# Patient Record
Sex: Female | Born: 1965 | Race: White | Hispanic: No | Marital: Married | Smoking: Current every day smoker
Health system: Southern US, Community
[De-identification: ages and names within clinical notes are randomized; demographics above are authoritative.]

## PROBLEM LIST (undated history)

## (undated) DIAGNOSIS — K5792 Diverticulitis of intestine, part unspecified, without perforation or abscess without bleeding: Secondary | ICD-10-CM

## (undated) DIAGNOSIS — I10 Essential (primary) hypertension: Secondary | ICD-10-CM

## (undated) DIAGNOSIS — E162 Hypoglycemia, unspecified: Secondary | ICD-10-CM

## (undated) DIAGNOSIS — K580 Irritable bowel syndrome with diarrhea: Secondary | ICD-10-CM

## (undated) DIAGNOSIS — G43001 Migraine without aura, not intractable, with status migrainosus: Secondary | ICD-10-CM

## (undated) DIAGNOSIS — M549 Dorsalgia, unspecified: Secondary | ICD-10-CM

## (undated) DIAGNOSIS — K219 Gastro-esophageal reflux disease without esophagitis: Secondary | ICD-10-CM

## (undated) DIAGNOSIS — F3130 Bipolar disorder, current episode depressed, mild or moderate severity, unspecified: Secondary | ICD-10-CM

## (undated) DIAGNOSIS — F319 Bipolar disorder, unspecified: Secondary | ICD-10-CM

## (undated) DIAGNOSIS — M797 Fibromyalgia: Secondary | ICD-10-CM

## (undated) DIAGNOSIS — E7800 Pure hypercholesterolemia, unspecified: Secondary | ICD-10-CM

## (undated) DIAGNOSIS — N952 Postmenopausal atrophic vaginitis: Secondary | ICD-10-CM

## (undated) HISTORY — DX: Dorsalgia, unspecified: M54.9

## (undated) HISTORY — DX: Bipolar disorder, current episode depressed, mild or moderate severity, unspecified: F31.30

## (undated) HISTORY — PX: TONSILLECTOMY: SUR1361

## (undated) HISTORY — DX: Irritable bowel syndrome with diarrhea: K58.0

## (undated) HISTORY — DX: Migraine without aura, not intractable, with status migrainosus: G43.001

## (undated) HISTORY — DX: Bipolar disorder, unspecified: F31.9

## (undated) HISTORY — PX: SPINAL FUSION: SHX223

## (undated) HISTORY — DX: Fibromyalgia: M79.7

## (undated) HISTORY — PX: APPENDECTOMY: SHX54

## (undated) HISTORY — DX: Postmenopausal atrophic vaginitis: N95.2

## (undated) HISTORY — PX: ABDOMINAL HYSTERECTOMY: SHX81

## (undated) HISTORY — DX: Gastro-esophageal reflux disease without esophagitis: K21.9

---

## 2003-01-17 ENCOUNTER — Encounter: Payer: Self-pay | Admitting: Neurosurgery

## 2003-01-17 ENCOUNTER — Ambulatory Visit (HOSPITAL_COMMUNITY): Admission: RE | Admit: 2003-01-17 | Discharge: 2003-01-18 | Payer: Self-pay | Admitting: Neurosurgery

## 2003-02-22 ENCOUNTER — Encounter: Admission: RE | Admit: 2003-02-22 | Discharge: 2003-02-22 | Payer: Self-pay | Admitting: Neurosurgery

## 2003-02-22 ENCOUNTER — Encounter: Payer: Self-pay | Admitting: Neurosurgery

## 2003-04-05 ENCOUNTER — Encounter: Admission: RE | Admit: 2003-04-05 | Discharge: 2003-04-05 | Payer: Self-pay | Admitting: Neurosurgery

## 2003-06-24 ENCOUNTER — Ambulatory Visit (HOSPITAL_COMMUNITY): Admission: RE | Admit: 2003-06-24 | Discharge: 2003-06-24 | Payer: Self-pay | Admitting: Neurosurgery

## 2005-02-27 ENCOUNTER — Ambulatory Visit: Payer: Self-pay

## 2005-03-10 ENCOUNTER — Ambulatory Visit: Payer: Self-pay

## 2005-12-03 ENCOUNTER — Ambulatory Visit: Payer: Self-pay | Admitting: Neurosurgery

## 2006-07-22 ENCOUNTER — Ambulatory Visit: Payer: Self-pay | Admitting: Gastroenterology

## 2007-04-21 ENCOUNTER — Other Ambulatory Visit: Payer: Self-pay

## 2007-04-21 ENCOUNTER — Emergency Department: Payer: Self-pay | Admitting: Unknown Physician Specialty

## 2007-05-18 ENCOUNTER — Emergency Department: Payer: Self-pay | Admitting: Emergency Medicine

## 2007-06-21 ENCOUNTER — Emergency Department: Payer: Self-pay | Admitting: Emergency Medicine

## 2007-06-22 ENCOUNTER — Ambulatory Visit: Payer: Self-pay | Admitting: Emergency Medicine

## 2007-08-31 ENCOUNTER — Ambulatory Visit: Payer: Self-pay | Admitting: Family Medicine

## 2007-09-05 ENCOUNTER — Ambulatory Visit: Payer: Self-pay | Admitting: Family Medicine

## 2009-09-26 LAB — HM DEXA SCAN: HM Dexa Scan: NORMAL

## 2009-10-15 ENCOUNTER — Ambulatory Visit: Payer: Self-pay | Admitting: Family Medicine

## 2010-01-07 ENCOUNTER — Ambulatory Visit: Payer: Self-pay | Admitting: Family Medicine

## 2010-01-07 ENCOUNTER — Emergency Department: Payer: Self-pay | Admitting: Emergency Medicine

## 2010-01-22 LAB — HM PAP SMEAR: HM Pap smear: ABNORMAL

## 2010-11-07 LAB — LIPID PANEL
Cholesterol: 191 mg/dL (ref 0–200)
HDL: 55 mg/dL (ref 35–70)
LDL Cholesterol: 110 mg/dL
Triglycerides: 131 mg/dL (ref 40–160)

## 2010-12-11 ENCOUNTER — Ambulatory Visit: Payer: Self-pay | Admitting: Family Medicine

## 2010-12-18 ENCOUNTER — Ambulatory Visit: Payer: Self-pay | Admitting: Urology

## 2012-02-16 ENCOUNTER — Ambulatory Visit: Payer: Self-pay | Admitting: Family Medicine

## 2012-05-19 LAB — HEMOGLOBIN A1C: Hgb A1c MFr Bld: 5.7 % (ref 4.0–6.0)

## 2013-04-12 ENCOUNTER — Ambulatory Visit: Payer: Self-pay | Admitting: Family Medicine

## 2013-04-12 LAB — HM MAMMOGRAPHY: HM Mammogram: NORMAL

## 2013-04-18 ENCOUNTER — Ambulatory Visit: Payer: Self-pay | Admitting: Family Medicine

## 2014-10-15 ENCOUNTER — Telehealth: Payer: Self-pay | Admitting: Family Medicine

## 2014-10-15 NOTE — Telephone Encounter (Signed)
Due to you not having anything available i had to schedule her an appointment for July 12. She will be completely out of her medications. Is it possible for you to refill? Please send to [place]

## 2014-10-15 NOTE — Telephone Encounter (Signed)
She needs to be seen, she takes controlled medications and I will not fill those until seen. The other medications she can ask pharmacy to request

## 2014-10-15 NOTE — Telephone Encounter (Signed)
Please advice  

## 2014-10-17 NOTE — Telephone Encounter (Signed)
Patient did schedule appointment for October 22, 2014

## 2014-10-20 ENCOUNTER — Encounter: Payer: Self-pay | Admitting: Family Medicine

## 2014-10-20 DIAGNOSIS — M549 Dorsalgia, unspecified: Secondary | ICD-10-CM

## 2014-10-20 DIAGNOSIS — K589 Irritable bowel syndrome without diarrhea: Secondary | ICD-10-CM | POA: Insufficient documentation

## 2014-10-20 DIAGNOSIS — K219 Gastro-esophageal reflux disease without esophagitis: Secondary | ICD-10-CM | POA: Insufficient documentation

## 2014-10-20 DIAGNOSIS — G8929 Other chronic pain: Secondary | ICD-10-CM | POA: Insufficient documentation

## 2014-10-20 DIAGNOSIS — M797 Fibromyalgia: Secondary | ICD-10-CM | POA: Insufficient documentation

## 2014-10-20 DIAGNOSIS — F3130 Bipolar disorder, current episode depressed, mild or moderate severity, unspecified: Secondary | ICD-10-CM | POA: Insufficient documentation

## 2014-10-20 DIAGNOSIS — Z720 Tobacco use: Secondary | ICD-10-CM | POA: Insufficient documentation

## 2014-10-20 DIAGNOSIS — N952 Postmenopausal atrophic vaginitis: Secondary | ICD-10-CM | POA: Insufficient documentation

## 2014-10-20 DIAGNOSIS — F5001 Anorexia nervosa, restricting type: Secondary | ICD-10-CM | POA: Insufficient documentation

## 2014-10-20 DIAGNOSIS — M479 Spondylosis, unspecified: Secondary | ICD-10-CM | POA: Insufficient documentation

## 2014-10-20 DIAGNOSIS — N6019 Diffuse cystic mastopathy of unspecified breast: Secondary | ICD-10-CM | POA: Insufficient documentation

## 2014-10-20 DIAGNOSIS — M5412 Radiculopathy, cervical region: Secondary | ICD-10-CM | POA: Insufficient documentation

## 2014-10-20 DIAGNOSIS — G43009 Migraine without aura, not intractable, without status migrainosus: Secondary | ICD-10-CM | POA: Insufficient documentation

## 2014-10-20 DIAGNOSIS — N3946 Mixed incontinence: Secondary | ICD-10-CM | POA: Insufficient documentation

## 2014-10-20 DIAGNOSIS — F410 Panic disorder [episodic paroxysmal anxiety] without agoraphobia: Secondary | ICD-10-CM | POA: Insufficient documentation

## 2014-10-20 HISTORY — DX: Bipolar disorder, current episode depressed, mild or moderate severity, unspecified: F31.30

## 2014-10-22 ENCOUNTER — Encounter: Payer: Self-pay | Admitting: Family Medicine

## 2014-10-22 ENCOUNTER — Ambulatory Visit (INDEPENDENT_AMBULATORY_CARE_PROVIDER_SITE_OTHER): Payer: BC Managed Care – PPO | Admitting: Family Medicine

## 2014-10-22 VITALS — BP 132/78 | HR 97 | Temp 98.3°F | Resp 18 | Ht 64.0 in | Wt 138.9 lb

## 2014-10-22 DIAGNOSIS — M79604 Pain in right leg: Secondary | ICD-10-CM

## 2014-10-22 DIAGNOSIS — K219 Gastro-esophageal reflux disease without esophagitis: Secondary | ICD-10-CM | POA: Diagnosis not present

## 2014-10-22 DIAGNOSIS — Z13220 Encounter for screening for lipoid disorders: Secondary | ICD-10-CM

## 2014-10-22 DIAGNOSIS — G43009 Migraine without aura, not intractable, without status migrainosus: Secondary | ICD-10-CM | POA: Diagnosis not present

## 2014-10-22 DIAGNOSIS — M797 Fibromyalgia: Secondary | ICD-10-CM | POA: Diagnosis not present

## 2014-10-22 DIAGNOSIS — G8929 Other chronic pain: Secondary | ICD-10-CM | POA: Diagnosis not present

## 2014-10-22 DIAGNOSIS — Z79899 Other long term (current) drug therapy: Secondary | ICD-10-CM

## 2014-10-22 DIAGNOSIS — M549 Dorsalgia, unspecified: Secondary | ICD-10-CM

## 2014-10-22 DIAGNOSIS — F410 Panic disorder [episodic paroxysmal anxiety] without agoraphobia: Secondary | ICD-10-CM

## 2014-10-22 DIAGNOSIS — K589 Irritable bowel syndrome without diarrhea: Secondary | ICD-10-CM

## 2014-10-22 DIAGNOSIS — R739 Hyperglycemia, unspecified: Secondary | ICD-10-CM

## 2014-10-22 MED ORDER — OMEPRAZOLE 20 MG PO CPDR: 20.0000 mg | DELAYED_RELEASE_CAPSULE | Freq: Every day | ORAL | Status: DC

## 2014-10-22 MED ORDER — HYDROCODONE-ACETAMINOPHEN 10-325 MG PO TABS: 1.0000 | ORAL_TABLET | Freq: Four times a day (QID) | ORAL | Status: DC | PRN

## 2014-10-22 MED ORDER — TRAMADOL HCL 50 MG PO TABS: 50.0000 mg | ORAL_TABLET | Freq: Four times a day (QID) | ORAL | Status: DC

## 2014-10-22 MED ORDER — CLONAZEPAM 1 MG PO TABS: 1.0000 mg | ORAL_TABLET | Freq: Every day | ORAL | Status: DC

## 2014-10-22 NOTE — Patient Instructions (Signed)
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
## 2014-11-13 ENCOUNTER — Ambulatory Visit: Payer: Self-pay | Admitting: Family Medicine

## 2015-01-22 ENCOUNTER — Ambulatory Visit (INDEPENDENT_AMBULATORY_CARE_PROVIDER_SITE_OTHER): Payer: BC Managed Care – PPO | Admitting: Family Medicine

## 2015-01-22 ENCOUNTER — Encounter: Payer: Self-pay | Admitting: Family Medicine

## 2015-01-22 VITALS — BP 120/82 | HR 100 | Temp 98.6°F | Resp 18 | Ht 64.0 in | Wt 126.0 lb

## 2015-01-22 DIAGNOSIS — G8929 Other chronic pain: Secondary | ICD-10-CM

## 2015-01-22 DIAGNOSIS — Z23 Encounter for immunization: Secondary | ICD-10-CM

## 2015-01-22 DIAGNOSIS — M797 Fibromyalgia: Secondary | ICD-10-CM | POA: Diagnosis not present

## 2015-01-22 DIAGNOSIS — F5001 Anorexia nervosa, restricting type: Secondary | ICD-10-CM | POA: Diagnosis not present

## 2015-01-22 DIAGNOSIS — K589 Irritable bowel syndrome without diarrhea: Secondary | ICD-10-CM

## 2015-01-22 DIAGNOSIS — R030 Elevated blood-pressure reading, without diagnosis of hypertension: Secondary | ICD-10-CM

## 2015-01-22 DIAGNOSIS — Z720 Tobacco use: Secondary | ICD-10-CM

## 2015-01-22 DIAGNOSIS — IMO0001 Reserved for inherently not codable concepts without codable children: Secondary | ICD-10-CM

## 2015-01-22 DIAGNOSIS — F3132 Bipolar disorder, current episode depressed, moderate: Secondary | ICD-10-CM

## 2015-01-22 DIAGNOSIS — M549 Dorsalgia, unspecified: Secondary | ICD-10-CM | POA: Diagnosis not present

## 2015-01-22 DIAGNOSIS — F410 Panic disorder [episodic paroxysmal anxiety] without agoraphobia: Secondary | ICD-10-CM

## 2015-01-22 MED ORDER — TRAMADOL HCL 50 MG PO TABS: 50.0000 mg | ORAL_TABLET | Freq: Four times a day (QID) | ORAL | Status: DC

## 2015-01-22 MED ORDER — HYDROCODONE-ACETAMINOPHEN 10-325 MG PO TABS: 1.0000 | ORAL_TABLET | Freq: Four times a day (QID) | ORAL | Status: DC | PRN

## 2015-01-22 MED ORDER — MIRTAZAPINE 15 MG PO TBDP: 15.0000 mg | ORAL_TABLET | Freq: Every day | ORAL | Status: DC

## 2015-01-22 MED ORDER — FLUOXETINE HCL (PMDD) 20 MG PO CAPS: 60.0000 mg | ORAL_CAPSULE | Freq: Every day | ORAL | Status: DC

## 2015-01-22 MED ORDER — CLONAZEPAM 1 MG PO TABS: 1.0000 mg | ORAL_TABLET | Freq: Two times a day (BID) | ORAL | Status: DC | PRN
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
## 2015-04-18 ENCOUNTER — Encounter: Payer: Self-pay | Admitting: Family Medicine

## 2015-04-18 ENCOUNTER — Ambulatory Visit (INDEPENDENT_AMBULATORY_CARE_PROVIDER_SITE_OTHER): Payer: BC Managed Care – PPO | Admitting: Family Medicine

## 2015-04-18 VITALS — BP 136/84 | HR 94 | Temp 99.0°F | Resp 16 | Ht 64.0 in | Wt 127.8 lb

## 2015-04-18 DIAGNOSIS — F3132 Bipolar disorder, current episode depressed, moderate: Secondary | ICD-10-CM | POA: Diagnosis not present

## 2015-04-18 DIAGNOSIS — M549 Dorsalgia, unspecified: Secondary | ICD-10-CM

## 2015-04-18 DIAGNOSIS — K219 Gastro-esophageal reflux disease without esophagitis: Secondary | ICD-10-CM | POA: Diagnosis not present

## 2015-04-18 DIAGNOSIS — M5412 Radiculopathy, cervical region: Secondary | ICD-10-CM | POA: Diagnosis not present

## 2015-04-18 DIAGNOSIS — M797 Fibromyalgia: Secondary | ICD-10-CM

## 2015-04-18 DIAGNOSIS — F410 Panic disorder [episodic paroxysmal anxiety] without agoraphobia: Secondary | ICD-10-CM | POA: Diagnosis not present

## 2015-04-18 DIAGNOSIS — G8929 Other chronic pain: Secondary | ICD-10-CM | POA: Diagnosis not present

## 2015-04-18 MED ORDER — PREGABALIN 50 MG PO CAPS
50.0000 mg | ORAL_CAPSULE | Freq: Three times a day (TID) | ORAL | Status: DC
Start: 2015-04-18 — End: 2015-08-28

## 2015-04-18 MED ORDER — CLONAZEPAM 1 MG PO TABS: 1.0000 mg | ORAL_TABLET | Freq: Two times a day (BID) | ORAL | Status: DC | PRN

## 2015-04-18 MED ORDER — FLUOXETINE HCL (PMDD) 20 MG PO CAPS: 60.0000 mg | ORAL_CAPSULE | Freq: Every day | ORAL | Status: DC

## 2015-04-18 MED ORDER — OMEPRAZOLE 20 MG PO CPDR: 20.0000 mg | DELAYED_RELEASE_CAPSULE | Freq: Every day | ORAL | Status: DC

## 2015-04-18 MED ORDER — TRAMADOL HCL 50 MG PO TABS: 50.0000 mg | ORAL_TABLET | Freq: Four times a day (QID) | ORAL | Status: DC

## 2015-04-18 MED ORDER — HYDROCODONE-ACETAMINOPHEN 10-325 MG PO TABS: 1.0000 | ORAL_TABLET | Freq: Four times a day (QID) | ORAL | Status: DC | PRN
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
## 2015-04-19 ENCOUNTER — Other Ambulatory Visit: Payer: Self-pay

## 2015-08-20 ENCOUNTER — Encounter (HOSPITAL_COMMUNITY): Payer: Self-pay | Admitting: *Deleted

## 2015-08-20 ENCOUNTER — Emergency Department (HOSPITAL_COMMUNITY)
Admission: EM | Admit: 2015-08-20 | Discharge: 2015-08-21 | Disposition: A | Discharge status: Home / Self Care | Payer: BC Managed Care – PPO | Attending: Emergency Medicine | Admitting: Emergency Medicine

## 2015-08-20 ENCOUNTER — Emergency Department (HOSPITAL_COMMUNITY): Payer: BC Managed Care – PPO

## 2015-08-20 DIAGNOSIS — R55 Syncope and collapse: Secondary | ICD-10-CM | POA: Diagnosis present

## 2015-08-20 DIAGNOSIS — W1800XA Striking against unspecified object with subsequent fall, initial encounter: Secondary | ICD-10-CM | POA: Insufficient documentation

## 2015-08-20 DIAGNOSIS — Z792 Long term (current) use of antibiotics: Secondary | ICD-10-CM | POA: Insufficient documentation

## 2015-08-20 DIAGNOSIS — Z79899 Other long term (current) drug therapy: Secondary | ICD-10-CM | POA: Insufficient documentation

## 2015-08-20 DIAGNOSIS — F3131 Bipolar disorder, current episode depressed, mild: Secondary | ICD-10-CM | POA: Insufficient documentation

## 2015-08-20 DIAGNOSIS — F17290 Nicotine dependence, other tobacco product, uncomplicated: Secondary | ICD-10-CM | POA: Diagnosis not present

## 2015-08-20 DIAGNOSIS — Y999 Unspecified external cause status: Secondary | ICD-10-CM | POA: Diagnosis not present

## 2015-08-20 DIAGNOSIS — S0990XA Unspecified injury of head, initial encounter: Secondary | ICD-10-CM

## 2015-08-20 DIAGNOSIS — S022XXA Fracture of nasal bones, initial encounter for closed fracture: Secondary | ICD-10-CM | POA: Diagnosis not present

## 2015-08-20 DIAGNOSIS — Y929 Unspecified place or not applicable: Secondary | ICD-10-CM | POA: Diagnosis not present

## 2015-08-20 DIAGNOSIS — S01511A Laceration without foreign body of lip, initial encounter: Secondary | ICD-10-CM

## 2015-08-20 DIAGNOSIS — Y939 Activity, unspecified: Secondary | ICD-10-CM | POA: Diagnosis not present

## 2015-08-20 HISTORY — DX: Hypoglycemia, unspecified: E16.2

## 2015-08-20 LAB — CBG MONITORING, ED: Glucose-Capillary: 113 mg/dL — ABNORMAL HIGH (ref 65–99)

## 2015-08-20 MED ORDER — LIDOCAINE HCL (PF) 1 % IJ SOLN
5.0000 mL | Freq: Once | INTRAMUSCULAR | Status: AC
  Administered 2015-08-21: 5 mL via INTRADERMAL
  Filled 2015-08-20: qty 5

## 2015-08-20 MED ORDER — ONDANSETRON HCL 4 MG/2ML IJ SOLN
4.0000 mg | Freq: Once | INTRAMUSCULAR | Status: AC
  Administered 2015-08-21: 4 mg via INTRAVENOUS
  Filled 2015-08-20: qty 2

## 2015-08-20 MED ORDER — MORPHINE SULFATE (PF) 4 MG/ML IV SOLN
4.0000 mg | Freq: Once | INTRAVENOUS | Status: AC
  Administered 2015-08-21: 4 mg via INTRAVENOUS
  Filled 2015-08-20: qty 1

## 2015-08-20 MED ORDER — SODIUM CHLORIDE 0.9 % IV BOLUS (SEPSIS)
1000.0000 mL | Freq: Once | INTRAVENOUS | Status: AC
  Administered 2015-08-21: 1000 mL via INTRAVENOUS

## 2015-08-20 MED ORDER — OXYMETAZOLINE HCL 0.05 % NA SOLN
3.0000 | Freq: Once | NASAL | Status: AC
  Administered 2015-08-21: 3 via NASAL
  Filled 2015-08-20: qty 15

## 2015-08-20 NOTE — ED Notes (Addendum)
Pt states that she thinks her blood sugar may have dropped on her, causing her to "black out" and fall, pt has laceration noted to bottom lip, blood to left nare, pt states that she did eat prior to coming to er, cbg 113 in triage,

## 2015-08-20 NOTE — ED Provider Notes (Signed)
By signing my name below, I, Nicole Kindred, attest that this documentation has been prepared under the direction and in the presence of [place], DO.   Electronically Signed: Nicole Kindred, ED Scribe. 08/21/2015. 1:00 AM  TIME SEEN: 11:24 PM  CHIEF COMPLAINT: Fall  HPI: HPI Comments: Nancy Lucero is a 50 y.o. female with PMHx of chronic neck pain, bipolar disorder, fibromyalgia, reported history of hypoglycemia and heart palpitations who presents to the Emergency Department complaining of a syncopal event that occurred around 9 PM tonight. States that she noticed that something was wrong and she began to feel very anxious. She notes associated shortness of breath before fainting, anxiety, and says she "knew something was coming on". Pt states she believes that her sugar "bottomed out". She did not check her blood sugar at home but states she has had hypoglycemic episodes in the past. States she drank a Pepsi, ate cottage cheese prior to coming to the hospital. No other associated symptoms noted. No worsening or alleviating factors noted. Pt denies hx of DM, seizure-like symptoms, fever, chest pain, vomiting, diarrhea, vaginal bleeding, bloody stool or melena, numbness, tingling, weakness, or any other pertinent symptoms. Pt is UTD on her tetanus. Significant other at bedside witnessed this episode and did not see any seizure activity. No tongue biting or incontinence. Patient states she did fall onto her face and has a laceration to the lower lip. Also has a left-sided nosebleed. Not on anticoagulation, antiplatelets. States she did have one beer tonight. No drug use.  ROS: See HPI Constitutional: no fever  Eyes: no drainage  ENT: no runny nose   Cardiovascular:  no chest pain  Resp: no SOB  GI: no vomiting GU: no dysuria Integumentary: no rash  Allergy: no hives  Musculoskeletal: no leg swelling  Neurological: no slurred speech ROS otherwise negative  PAST MEDICAL  HISTORY/PAST SURGICAL HISTORY:  Past Medical History  Diagnosis Date  . Irritable bowel syndrome with diarrhea   . Atrophic vaginitis   . Bipolar affective ([place])   . Backache   . GERD (gastroesophageal reflux disease)   . Fibromyalgia   . Migraine without aura with status migrainosus   . Hypoglycemia     MEDICATIONS:  Prior to Admission medications   Medication Sig Start Date End Date Taking? Authorizing Provider  clindamycin (CLEOCIN) 150 MG capsule  04/13/15   Historical Provider, MD  clonazePAM (KLONOPIN) 1 MG tablet Take 1 tablet (1 mg total) by mouth 2 (two) times daily as needed for anxiety. 04/18/15   Steele Sizer, MD  cyclobenzaprine (FLEXERIL) 10 MG tablet Take by mouth. 03/16/14   Historical Provider, MD  Fluoxetine HCl, PMDD, 20 MG CAPS Take 3 capsules (60 mg total) by mouth daily. 04/18/15   Steele Sizer, MD  HYDROcodone-acetaminophen (NORCO) 10-325 MG tablet Take 1 tablet by mouth every 6 (six) hours as needed. 04/18/15   Steele Sizer, MD  hyoscyamine (LEVSIN, ANASPAZ) 0.125 MG tablet Take by mouth. 12/15/13   Historical Provider, MD  mirtazapine (REMERON SOL-TAB) 15 MG disintegrating tablet Take 1 tablet (15 mg total) by mouth at bedtime. Up to two daily for anorexia 01/22/15   Steele Sizer, MD  MULTIPLE VITAMIN PO Take by mouth.    Historical Provider, MD  omeprazole (PRILOSEC) 20 MG capsule Take 1 capsule (20 mg total) by mouth daily. 04/18/15   Steele Sizer, MD  Ospemifene (OSPHENA) 60 MG TABS Take by mouth. 06/22/14   Historical Provider, MD  pregabalin (LYRICA) 50 MG capsule  Take 1 capsule (50 mg total) by mouth 3 (three) times daily. 04/18/15   Steele Sizer, MD  Probiotic TBEC Take by mouth. 06/27/14   Historical Provider, MD  traMADol (ULTRAM) 50 MG tablet Take 1 tablet (50 mg total) by mouth 4 (four) times daily. 04/18/15   Steele Sizer, MD    ALLERGIES:  Allergies  Allergen Reactions  . Cephalexin   . Penicillins   . Sulfa Antibiotics     SOCIAL  HISTORY:  Social History  Substance Use Topics  . Smoking status: Current Some Day Smoker    Types: E-cigarettes    Last Attempt to Quit: 01/21/1978  . Smokeless tobacco: Never Used     Comment: patient states she has quit several times but then she starts again  . Alcohol Use: 4.2 oz/week    7 Standard drinks or equivalent per week     Comment: glass of wine/or 2 oz of alcohol every evening    FAMILY HISTORY: Family History  Problem Relation Age of Onset  . Depression Mother   . Mental illness Mother   . Heart disease Mother   . Depression Son   . Mental illness Son   . Mental illness Maternal Aunt   . Depression Son   . Mental illness Son   . Depression Son   . Mental illness Son     EXAM: BP 115/83 mmHg  Pulse 95  Temp(Src) 97.5 F (36.4 C) (Oral)  Resp 20  Ht 5\' 4"  (1.626 m)  Wt 125 lb (56.7 kg)  BMI 21.45 kg/m2  SpO2 100% CONSTITUTIONAL: Alert and oriented and responds appropriately to questions. Well-appearing; well-nourished; GCS 15 HEAD: Normocephalic, no sign of skull fracture EYES: Conjunctivae clear, PERRL, EOMI ENT: normal nose; no rhinorrhea; moist mucous membranes; pharynx without lesions noted; no dental injury; no hemotypanum; no septal hematoma; laceration to left lower lip; small amount of bleeding from left nostril  NECK: Supple, no meningismus, no LAD; no midline spinal tenderness, step-off or deformity CARD: RRR; S1 and S2 appreciated; no murmurs, no clicks, no rubs, no gallops RESP: Normal chest excursion without splinting or tachypnea; breath sounds clear and equal bilaterally; no wheezes, no rhonchi, no rales; chest wall stable, nontender to palpation ABD/GI: Normal bowel sounds; non-distended; soft, non-tender, no rebound, no guarding PELVIS:  stable, nontender to palpation BACK:  The back appears normal and is non-tender to palpation, there is no CVA tenderness; no midline spinal tenderness, step-off or deformity EXT: Normal ROM in all joints;  non-tender to palpation; no edema; normal capillary refill; no cyanosis    SKIN: Normal color for age and race; warm NEURO: Moves all extremities equally, sensation to light touch intact diffusely, cranial nerves II through XII intact, normal gait PSYCH: The patient's mood and manner are appropriate. Grooming and personal hygiene are appropriate.  MEDICAL DECISION MAKING: Patient here syncopal event. She states that she felt like she was going to pass out which made her feel very anxious and she began hyperventilating and became short of breath. No chest pain. Reports she has had episodes of syncope in the past when her blood sugar has dropped. She is not on any medications that would lower her blood sugar. Denies being an alcoholic. Does state she had one alcoholic beverage tonight. Complaining of headache and has bleeding from the left nostril and a laceration to the left lower lip. She reports her last tetanus vaccination was within the past 5 years. We will obtain a CT of her head,  cervical spine and face. Nancy Lucero will repair the patient's laceration.  As for her syncopal event, I am concerned for ACS, PE given this episode of shortness of breath. She reports to me that she has had many episodes in the past of hypoglycemia is not diabetic and has a history of insulinoma. Reports that she did not eat much today. Blood glucose was 113 but this was after drinking Pepsi and eating food at home. We'll continue to monitor her closely. We'll obtain cardiac labs including a troponin and d-dimer. EKG shows no ischemic abnormality, arrhythmia.  ED PROGRESS: The patient's labs are unremarkable. She's had 2 negative troponins. D-dimer is also negative. She has normal electrolytes and normal hemoglobin. She is status post hysterectomy. CT scan show an acute fracture of the anterior nasal spine but otherwise no acute abnormality. Have advised her not to blow her nose for the next week, keep the head of her bed  elevated when she is sleeping, apply ice to this area. Discussed with her that there would be nothing done acutely for this injury. She can follow-up as an outpatient with her PCP. She has been prescribed tramadol and hydrocodone recently for chronic neck pain. I do not feel she needs to be discharged with any further narcotic pain medication. Discussed head injury return precautions with patient and family. We have gotten her nosebleed to subside using Afrin nasal spray and direct pressure. I feel she is safe for discharge home.   At this time, I do not feel there is any life-threatening condition present. I have reviewed and discussed all results (EKG, imaging, lab, urine as appropriate), exam findings with patient. I have reviewed nursing notes and appropriate previous records.  I feel the patient is safe to be discharged home without further emergent workup. Discussed usual and customary return precautions. Patient and family (if present) verbalize understanding and are comfortable with this plan.  Patient will follow-up with their primary care provider. If they do not have a primary care provider, information for follow-up has been provided to them. All questions have been answered.     EKG Interpretation  Date/Time:  Wednesday August 21 2015 00:06:06 EDT Ventricular Rate:  87 PR Interval:  171 QRS Duration: 78 QT Interval:  361 QTC Calculation: 434 R Axis:   79 Text Interpretation:  Sinus rhythm Biatrial enlargement Probable left ventricular hypertrophy No significant change since last tracing Confirmed by Tecla Mailloux,  DO, Natnael Biederman [phone]) on 08/21/2015 12:22:25 AM      .Epistaxis Management Date/Time: 08/20/2015 Performed by: Nyra Jabs Authorized by: Nyra Jabs Consent: Verbal consent obtained. Consent given by: patient Patient identity confirmed: verbally with patient Patient sedated: no Treatment site: left anterior Repair method: Afrin nasal spray and direct  pressure Post-procedure assessment: bleeding stopped Treatment complexity: simple Patient tolerance: Patient tolerated the procedure well with no immediate complications   I personally performed the services described in this documentation, which was scribed in my presence. The recorded information has been reviewed and is accurate.    [place], DO 08/21/15 [phone]

## 2015-08-20 NOTE — ED Notes (Signed)
Patient has laceration noted to inside of lower lip, dried blood and abrasion noted to inside upper gums, and bleeding noted to left nare. Patient states "it felt like my sugar dropped so I went to lay down but I passed out and face planted on the carpet floor."

## 2015-08-21 LAB — COMPREHENSIVE METABOLIC PANEL
ALT: 12 U/L — ABNORMAL LOW (ref 14–54)
AST: 21 U/L (ref 15–41)
Albumin: 4.6 g/dL (ref 3.5–5.0)
Alkaline Phosphatase: 53 U/L (ref 38–126)
Anion gap: 9 (ref 5–15)
BUN: 27 mg/dL — ABNORMAL HIGH (ref 6–20)
CO2: 27 mmol/L (ref 22–32)
Calcium: 9.2 mg/dL (ref 8.9–10.3)
Chloride: 102 mmol/L (ref 101–111)
Creatinine, Ser: 0.81 mg/dL (ref 0.44–1.00)
GFR calc Af Amer: >60 mL/min (ref >60)
GFR calc non Af Amer: >60 mL/min (ref >60)
Glucose, Bld: 106 mg/dL — ABNORMAL HIGH (ref 65–99)
Potassium: 3.9 mmol/L (ref 3.5–5.1)
Sodium: 138 mmol/L (ref 135–145)
Total Bilirubin: 0.2 mg/dL — ABNORMAL LOW (ref 0.3–1.2)
Total Protein: 7.5 g/dL (ref 6.5–8.1)

## 2015-08-21 LAB — CBC WITH DIFFERENTIAL/PLATELET
Basophils Absolute: 0 10*3/uL (ref 0.0–0.1)
Basophils Relative: 0 %
Eosinophils Absolute: 0.2 10*3/uL (ref 0.0–0.7)
Eosinophils Relative: 2 %
HCT: 40.1 % (ref 36.0–46.0)
Hemoglobin: 13.4 g/dL (ref 12.0–15.0)
Lymphocytes Relative: 21 %
Lymphs Abs: 2 10*3/uL (ref 0.7–4.0)
MCH: 30.6 pg (ref 26.0–34.0)
MCHC: 33.4 g/dL (ref 30.0–36.0)
MCV: 91.6 fL (ref 78.0–100.0)
Monocytes Absolute: 1 10*3/uL (ref 0.1–1.0)
Monocytes Relative: 11 %
Neutro Abs: 6.2 10*3/uL (ref 1.7–7.7)
Neutrophils Relative %: 66 %
Platelets: 205 10*3/uL (ref 150–400)
RBC: 4.38 MIL/uL (ref 3.87–5.11)
RDW: 13.3 % (ref 11.5–15.5)
WBC: 9.5 10*3/uL (ref 4.0–10.5)

## 2015-08-21 LAB — ETHANOL: Alcohol, Ethyl (B): <5 mg/dL (ref <5)

## 2015-08-21 LAB — TROPONIN I: Troponin I: <0.03 ng/mL (ref <0.031)

## 2015-08-21 LAB — I-STAT TROPONIN, ED: Troponin i, poc: 0 ng/mL (ref 0.00–0.08)

## 2015-08-21 LAB — D-DIMER, QUANTITATIVE: D-Dimer, Quant: <0.27 ug/mL-FEU (ref 0.00–0.50)

## 2015-08-21 MED ORDER — HYDROMORPHONE HCL 1 MG/ML IJ SOLN
1.0000 mg | Freq: Once | INTRAMUSCULAR | Status: AC
  Administered 2015-08-21: 1 mg via INTRAVENOUS
  Filled 2015-08-21: qty 1

## 2015-08-21 NOTE — ED Provider Notes (Signed)
I was asked to suture patients lip laceration by Dr. Leonides Schanz.  This is my only involvement in patients care.  LACERATION REPAIR Performed by: Evalee Jefferson Authorized by: Evalee Jefferson Consent: Verbal consent obtained. Risks and benefits: risks, benefits and alternatives were discussed Consent given by: patient Patient identity confirmed: provided demographic data Prepped and Draped in normal sterile fashion Wound explored  Laceration Location: left lower lip, to but not crossing the vermilion border  Laceration Length: 2 cm flap laceration  No Foreign Bodies seen or palpated  Anesthesia: local infiltration  Local anesthetic: lidocaine 1% without epinephrine  Anesthetic total: 2 ml  Irrigation method: syringe Amount of cleaning: standard  Skin closure: absorbable  gut 6-0  Number of sutures: 8  Technique: simple interrupted.  Patient tolerance: Patient tolerated the procedure well with no immediate complications.   Evalee Jefferson, PA-C 08/21/15 [place], DO 08/21/15 [phone]

## 2015-08-21 NOTE — Discharge Instructions (Signed)
Mouth Laceration A mouth laceration is a deep cut in the lining of your mouth (mucosa). The laceration may extend into your lip or go all of the way through your mouth and cheek. Lacerations inside your mouth may involve your tongue, the insides of your cheeks, or the upper surface of your mouth (palate). Mouth lacerations may bleed a lot because your mouth has a very rich blood supply. Mouth lacerations may need to be repaired with stitches (sutures). CAUSES Any type of facial injury can cause a mouth laceration. Common causes include:  Getting hit in the mouth.  Being in a car accident. SYMPTOMS The most common sign of a mouth laceration is bleeding that fills the mouth. DIAGNOSIS Your health care provider can diagnose a mouth laceration by examining your mouth. Your mouth may need to be washed out (irrigated) with a sterile salt-water (saline) solution. Your health care provider may also have to remove any blood clots to determine how bad your injury is. You may need X-rays of the bones in your jaw or your face to rule out other injuries, such as dental injuries, facial fractures, or jaw fractures. TREATMENT Treatment depends on the location and severity of your injury. Small mouth lacerations may not need treatment if bleeding has stopped. You may need sutures if:  You have a tongue laceration.  Your mouth laceration is large or deep, or it continues to bleed. If sutures are necessary, your health care provider will use absorbable sutures that dissolve as your body heals. You may also receive antibiotic medicine or a tetanus shot. HOME CARE INSTRUCTIONS  Take medicines only as directed by your health care provider.  If you were prescribed an antibiotic medicine, finish all of it even if you start to feel better.  Eat as directed by your health care provider. You may only be able to drink liquids or eat soft foods for a few days.  Rinse your mouth with a warm, salt-water rinse 4-6  times per day or as directed by your health care provider. You can make a salt-water rinse by mixing one tsp of salt into two cups of warm water.  Do not poke the sutures with your tongue. Doing that can loosen them.  Check your wound every day for signs of infection. It is normal to have a white or gray patch over your wound while it heals. Watch for:  Redness.  Swelling.  Blood or pus.  Maintain regular oral hygiene, if possible. Gently brush your teeth with a soft, nylon-bristled toothbrush 2 times per day.  Keep all follow-up visits as directed by your health care provider. This is important. SEEK MEDICAL CARE IF:  You were given a tetanus shot and have swelling, severe pain, redness, or bleeding at the injection site.  You have a fever.  Your pain is not controlled with medicine.  You have redness, swelling, or pain at your wound that is getting worse.  You have fresh bleeding or pus coming from your wound.  The edges of your wound break open.  You develop swollen, tender glands in your throat. SEEK IMMEDIATE MEDICAL CARE IF:   Your face or the area under your jaw becomes swollen.  You have trouble breathing or swallowing.   This information is not intended to replace advice given to you by your health care provider. Make sure you discuss any questions you have with your health care provider.   Document Released: 04/20/2005 Document Revised: 09/04/2014 Document Reviewed: 04/11/2014 Elsevier Interactive Patient  Education 2016 [employer].    Nasal Fracture A nasal fracture is a break or crack in the bones or cartilage of the nose. Minor breaks do not require treatment. These breaks usually heal on their own after about one month. Serious breaks may require surgery. CAUSES This injury is usually caused by a blunt injury to the nose. This type of injury often occurs from:  Contact sports.  Car accidents.  Falls.  Getting punched. SYMPTOMS Symptoms of this  injury include:  Pain.  Swelling of the nose.  Bleeding from the nose.  Bruising around the nose or eyes. This may include having black eyes.  Crooked appearance of the nose. DIAGNOSIS This injury may be diagnosed with a physical exam. The health care provider will gently feel the nose for signs of broken bones. He or she will look inside the nostrils to make sure that there is not a blood-filled swelling on the dividing wall between the nostrils (septal hematoma). X-rays of the nose may not show a nasal fracture even when one is present. In some cases, X-rays or a CT scan may be done 1-5 days after the injury. Sometimes, the health care provider will want to wait until the swelling has gone down. TREATMENT Often, minor fractures that have caused no deformity do not require treatment. More serious fractures in which bones have moved out of position may require surgery, which will take place after the swelling is gone. Surgery will stabilize and align the fracture. In some cases, a health care provider may be able to reposition the bones without surgery. This may be done in the health care provider's office after medicine is given to numb the area (local anesthetic). HOME CARE INSTRUCTIONS  If directed, apply ice to the injured area:  Put ice in a plastic bag.  Place a towel between your skin and the bag.  Leave the ice on for 20 minutes, 2-3 times per day.  Take over-the-counter and prescription medicines only as told by your health care provider.  If your nose starts to bleed, sit in an upright position while you squeeze the soft parts of your nose against the dividing wall between your nostrils (septum) for 10 minutes.  Try to avoid blowing your nose.  Return to your normal activities as told by your health care provider. Ask your health care provider what activities are safe for you.  Avoid contact sports for 3-4 weeks or as told by your health care provider.  Keep all follow-up  visits as told by your health care provider. This is important. SEEK MEDICAL CARE IF:  Your pain increases or becomes severe.  You continue to have nosebleeds.  The shape of your nose does not return to normal within 5 days.  You have pus draining out of your nose. SEEK IMMEDIATE MEDICAL CARE IF:  You have bleeding from your nose that does not stop after you pinch your nostrils closed for 20 minutes and keep ice on your nose.  You have clear fluid draining out of your nose.  You notice a grape-like swelling on the septum. This swelling is a collection of blood (hematoma) that must be drained to help prevent infection.  You have difficulty moving your eyes.  You have repeated vomiting.   This information is not intended to replace advice given to you by your health care provider. Make sure you discuss any questions you have with your health care provider.   Document Released: 04/17/2000 Document Revised: 01/09/2015 Document Reviewed:  05/28/2014 Elsevier Interactive Patient Education 2016 [place] Injury, Adult You have a head injury. Headaches and throwing up (vomiting) are common after a head injury. It should be easy to wake up from sleeping. Sometimes you must stay in the hospital. Most problems happen within the first 24 hours. Side effects may occur up to 7-10 days after the injury.  WHAT ARE THE TYPES OF HEAD INJURIES? Head injuries can be as minor as a bump. Some head injuries can be more severe. More severe head injuries include:  A jarring injury to the brain (concussion).  A bruise of the brain (contusion). This mean there is bleeding in the brain that can cause swelling.  A cracked skull (skull fracture).  Bleeding in the brain that collects, clots, and forms a bump (hematoma). WHEN SHOULD I GET HELP RIGHT AWAY?   You are confused or sleepy.  You cannot be woken up.  You feel sick to your stomach (nauseous) or keep throwing up (vomiting).  Your  dizziness or unsteadiness is getting worse.  You have very bad, lasting headaches that are not helped by medicine. Take medicines only as told by your doctor.  You cannot use your arms or legs like normal.  You cannot walk.  You notice changes in the black spots in the center of the colored part of your eye (pupil).  You have clear or bloody fluid coming from your nose or ears.  You have trouble seeing. During the next 24 hours after the injury, you must stay with someone who can watch you. This person should get help right away (call 911 in the [place] if you start to shake and are not able to control it (have seizures), you pass out, or you are unable to wake up. HOW CAN I PREVENT A HEAD INJURY IN THE FUTURE?  Wear seat belts.  Wear a helmet while bike riding and playing sports like football.  Stay away from dangerous activities around the house. WHEN CAN I RETURN TO NORMAL ACTIVITIES AND ATHLETICS? See your doctor before doing these activities. You should not do normal activities or play contact sports until 1 week after the following symptoms have stopped:  Headache that does not go away.  Dizziness.  Poor attention.  Confusion.  Memory problems.  Sickness to your stomach or throwing up.  Tiredness.  Fussiness.  Bothered by bright lights or loud noises.  Anxiousness or depression.  Restless sleep. MAKE SURE YOU:   Understand these instructions.  Will watch your condition.  Will get help right away if you are not doing well or get worse.   This information is not intended to replace advice given to you by your health care provider. Make sure you discuss any questions you have with your health care provider.   Document Released: 04/02/2008 Document Revised: 05/11/2014 Document Reviewed: 12/26/2012 Elsevier Interactive Patient Education 2016 [employer].  Syncope Syncope is a medical term for fainting or passing out. This means you lose consciousness and  drop to the ground. People are generally unconscious for less than 5 minutes. You may have some muscle twitches for up to 15 seconds before waking up and returning to normal. Syncope occurs more often in older adults, but it can happen to anyone. While most causes of syncope are not dangerous, syncope can be a sign of a serious medical problem. It is important to seek medical care.  CAUSES  Syncope is caused by a sudden drop in blood flow to the brain.  The specific cause is often not determined. Factors that can bring on syncope include:  Taking medicines that lower blood pressure.  Sudden changes in posture, such as standing up quickly.  Taking more medicine than prescribed.  Standing in one place for too long.  Seizure disorders.  Dehydration and excessive exposure to heat.  Low blood sugar (hypoglycemia).  Straining to have a bowel movement.  Heart disease, irregular heartbeat, or other circulatory problems.  Fear, emotional distress, seeing blood, or severe pain. SYMPTOMS  Right before fainting, you may:  Feel dizzy or light-headed.  Feel nauseous.  See all white or all black in your field of vision.  Have cold, clammy skin. DIAGNOSIS  Your health care provider will ask about your symptoms, perform a physical exam, and perform an electrocardiogram (ECG) to record the electrical activity of your heart. Your health care provider may also perform other heart or blood tests to determine the cause of your syncope which may include:  Transthoracic echocardiogram (TTE). During echocardiography, sound waves are used to evaluate how blood flows through your heart.  Transesophageal echocardiogram (TEE).  Cardiac monitoring. This allows your health care provider to monitor your heart rate and rhythm in real time.  Holter monitor. This is a portable device that records your heartbeat and can help diagnose heart arrhythmias. It allows your health care provider to track your heart  activity for several days, if needed.  Stress tests by exercise or by giving medicine that makes the heart beat faster. TREATMENT  In most cases, no treatment is needed. Depending on the cause of your syncope, your health care provider may recommend changing or stopping some of your medicines. HOME CARE INSTRUCTIONS  Have someone stay with you until you feel stable.  Do not drive, use machinery, or play sports until your health care provider says it is okay.  Keep all follow-up appointments as directed by your health care provider.  Lie down right away if you start feeling like you might faint. Breathe deeply and steadily. Wait until all the symptoms have passed.  Drink enough fluids to keep your urine clear or pale yellow.  If you are taking blood pressure or heart medicine, get up slowly and take several minutes to sit and then stand. This can reduce dizziness. SEEK IMMEDIATE MEDICAL CARE IF:   You have a severe headache.  You have unusual pain in the chest, abdomen, or back.  You are bleeding from your mouth or rectum, or you have black or tarry stool.  You have an irregular or very fast heartbeat.  You have pain with breathing.  You have repeated fainting or seizure-like jerking during an episode.  You faint when sitting or lying down.  You have confusion.  You have trouble walking.  You have severe weakness.  You have vision problems. If you fainted, call your local emergency services (911 in [place] Do not drive yourself to the hospital.    This information is not intended to replace advice given to you by your health care provider. Make sure you discuss any questions you have with your health care provider.   Document Released: 04/20/2005 Document Revised: 09/04/2014 Document Reviewed: 06/19/2011 Elsevier Interactive Patient Education [employer].

## 2015-08-21 NOTE — ED Notes (Signed)
Patient ambulated to the restroom with minimal assistance 

## 2015-08-21 NOTE — ED Notes (Signed)
Pt gone over to CT; pt's earrings given to husband

## 2015-08-22 ENCOUNTER — Telehealth: Payer: Self-pay

## 2015-08-22 NOTE — Telephone Encounter (Signed)
Has   i know, i know           After consulting with Dr. Ancil Boozer, I contacted the patient and informed her that since she has not been seen 04/18/15, that the only thing we could call in for her will be Tramadol. She stated that she already has tramadol and it was not working. She stated, "i'm dying but I guess I gotta wait. I'll just keep taking bd's and Tylenol."   She was given an appt for 08/28/15 at 12:20pm.

## 2015-08-22 NOTE — Telephone Encounter (Signed)
Tried to contact patient back to see how we could be of assistance to her regarding her ER visit and pain, but there was no answer. A message was left for her to give [place] a call when she got the chance.

## 2015-08-28 ENCOUNTER — Encounter: Payer: Self-pay | Admitting: Family Medicine

## 2015-08-28 ENCOUNTER — Ambulatory Visit: Payer: BC Managed Care – PPO | Admitting: Family Medicine

## 2015-08-28 ENCOUNTER — Ambulatory Visit (INDEPENDENT_AMBULATORY_CARE_PROVIDER_SITE_OTHER): Payer: BC Managed Care – PPO | Admitting: Family Medicine

## 2015-08-28 VITALS — BP 156/88 | HR 100 | Temp 98.3°F | Resp 18 | Ht 64.0 in | Wt 124.4 lb

## 2015-08-28 DIAGNOSIS — R030 Elevated blood-pressure reading, without diagnosis of hypertension: Secondary | ICD-10-CM | POA: Insufficient documentation

## 2015-08-28 DIAGNOSIS — R55 Syncope and collapse: Secondary | ICD-10-CM | POA: Insufficient documentation

## 2015-08-28 DIAGNOSIS — M5412 Radiculopathy, cervical region: Secondary | ICD-10-CM | POA: Diagnosis not present

## 2015-08-28 DIAGNOSIS — F419 Anxiety disorder, unspecified: Secondary | ICD-10-CM | POA: Insufficient documentation

## 2015-08-28 DIAGNOSIS — I341 Nonrheumatic mitral (valve) prolapse: Secondary | ICD-10-CM | POA: Insufficient documentation

## 2015-08-28 DIAGNOSIS — G8929 Other chronic pain: Secondary | ICD-10-CM

## 2015-08-28 DIAGNOSIS — IMO0001 Reserved for inherently not codable concepts without codable children: Secondary | ICD-10-CM

## 2015-08-28 DIAGNOSIS — Z87898 Personal history of other specified conditions: Secondary | ICD-10-CM | POA: Insufficient documentation

## 2015-08-28 DIAGNOSIS — Z124 Encounter for screening for malignant neoplasm of cervix: Secondary | ICD-10-CM | POA: Insufficient documentation

## 2015-08-28 DIAGNOSIS — E785 Hyperlipidemia, unspecified: Secondary | ICD-10-CM | POA: Insufficient documentation

## 2015-08-28 DIAGNOSIS — Z9181 History of falling: Secondary | ICD-10-CM

## 2015-08-28 DIAGNOSIS — S022XXD Fracture of nasal bones, subsequent encounter for fracture with routine healing: Secondary | ICD-10-CM

## 2015-08-28 DIAGNOSIS — F410 Panic disorder [episodic paroxysmal anxiety] without agoraphobia: Secondary | ICD-10-CM

## 2015-08-28 DIAGNOSIS — F319 Bipolar disorder, unspecified: Secondary | ICD-10-CM | POA: Insufficient documentation

## 2015-08-28 DIAGNOSIS — M549 Dorsalgia, unspecified: Secondary | ICD-10-CM

## 2015-08-28 MED ORDER — CLONAZEPAM 1 MG PO TABS: 1.0000 mg | ORAL_TABLET | Freq: Two times a day (BID) | ORAL | Status: DC | PRN

## 2015-08-28 MED ORDER — TRAMADOL HCL 50 MG PO TABS: 50.0000 mg | ORAL_TABLET | Freq: Four times a day (QID) | ORAL | Status: DC

## 2015-08-28 MED ORDER — OSPEMIFENE 60 MG PO TABS: 1.0000 | ORAL_TABLET | Freq: Every day | ORAL | Status: DC

## 2015-08-28 MED ORDER — HYDROCODONE-ACETAMINOPHEN 10-325 MG PO TABS: 1.0000 | ORAL_TABLET | Freq: Four times a day (QID) | ORAL | Status: DC | PRN

## 2015-08-28 MED ORDER — PREGABALIN 50 MG PO CAPS: 50.0000 mg | ORAL_CAPSULE | Freq: Three times a day (TID) | ORAL | Status: DC
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
## 2015-09-05 ENCOUNTER — Telehealth: Payer: Self-pay | Admitting: Family Medicine

## 2015-09-05 NOTE — Telephone Encounter (Signed)
THEY  ARE NEEDING TO KNOW THE SPECIFIC QUESTION THAT YOU ALL ARE NEEDING HER TO BE SEEN FOR AND ALSO HER HOSP WORK UP FOR SEIZURES. NEEDS LIKE CT REPORT ETC. JUST NEEDS CLARIFICATIONS. NEEDS RECORDS FOR REVIEW BEFORE THEY CAN SCHEDULE.

## 2015-09-05 NOTE — Telephone Encounter (Signed)
The requested information was printed and faxed.

## 2015-10-15 HISTORY — PX: KNEE SURGERY: SHX244

## 2015-11-27 ENCOUNTER — Ambulatory Visit: Payer: BC Managed Care – PPO | Admitting: Family Medicine

## 2015-12-05 DIAGNOSIS — Z0289 Encounter for other administrative examinations: Secondary | ICD-10-CM | POA: Insufficient documentation

## 2016-01-15 ENCOUNTER — Other Ambulatory Visit: Payer: Self-pay | Admitting: Family Medicine

## 2016-01-15 DIAGNOSIS — F410 Panic disorder [episodic paroxysmal anxiety] without agoraphobia: Secondary | ICD-10-CM

## 2016-01-15 NOTE — Telephone Encounter (Signed)
Patient requesting refill of Klonopin be sent to [facility].

## 2016-01-16 NOTE — Telephone Encounter (Signed)
Left message on machine pt needs appt since has not been seen since 08-2015

## 2016-02-03 HISTORY — PX: TOOTH EXTRACTION: SUR596

## 2016-03-31 ENCOUNTER — Ambulatory Visit (INDEPENDENT_AMBULATORY_CARE_PROVIDER_SITE_OTHER): Payer: BC Managed Care – PPO | Admitting: Family Medicine

## 2016-03-31 ENCOUNTER — Encounter: Payer: Self-pay | Admitting: Family Medicine

## 2016-03-31 VITALS — BP 132/68 | HR 87 | Temp 98.3°F | Resp 18 | Ht 64.0 in | Wt 131.7 lb

## 2016-03-31 DIAGNOSIS — Z9889 Other specified postprocedural states: Secondary | ICD-10-CM | POA: Diagnosis not present

## 2016-03-31 DIAGNOSIS — F5001 Anorexia nervosa, restricting type: Secondary | ICD-10-CM | POA: Diagnosis not present

## 2016-03-31 DIAGNOSIS — F3132 Bipolar disorder, current episode depressed, moderate: Secondary | ICD-10-CM

## 2016-03-31 DIAGNOSIS — M797 Fibromyalgia: Secondary | ICD-10-CM

## 2016-03-31 DIAGNOSIS — G43009 Migraine without aura, not intractable, without status migrainosus: Secondary | ICD-10-CM

## 2016-03-31 DIAGNOSIS — Z79899 Other long term (current) drug therapy: Secondary | ICD-10-CM | POA: Diagnosis not present

## 2016-03-31 DIAGNOSIS — Z1159 Encounter for screening for other viral diseases: Secondary | ICD-10-CM | POA: Diagnosis not present

## 2016-03-31 DIAGNOSIS — R739 Hyperglycemia, unspecified: Secondary | ICD-10-CM | POA: Diagnosis not present

## 2016-03-31 DIAGNOSIS — M5412 Radiculopathy, cervical region: Secondary | ICD-10-CM | POA: Diagnosis not present

## 2016-03-31 DIAGNOSIS — F410 Panic disorder [episodic paroxysmal anxiety] without agoraphobia: Secondary | ICD-10-CM | POA: Diagnosis not present

## 2016-03-31 MED ORDER — FLUOXETINE HCL 90 MG PO CPDR: 90.0000 mg | DELAYED_RELEASE_CAPSULE | ORAL | 0 refills | Status: DC

## 2016-03-31 MED ORDER — ARIPIPRAZOLE 10 MG PO TABS: 5.0000 mg | ORAL_TABLET | Freq: Every day | ORAL | 1 refills | Status: DC

## 2016-03-31 MED ORDER — CLONAZEPAM 1 MG PO TABS
1.0000 mg | ORAL_TABLET | Freq: Every day | ORAL | 2 refills | Status: DC | PRN
Start: 2016-03-31 — End: 2016-08-18

## 2016-03-31 MED ORDER — SUMATRIPTAN SUCCINATE 100 MG PO TABS: 100.0000 mg | ORAL_TABLET | ORAL | 0 refills | Status: DC | PRN

## 2016-03-31 MED ORDER — MIRTAZAPINE 15 MG PO TBDP: 15.0000 mg | ORAL_TABLET | Freq: Every day | ORAL | 2 refills | Status: DC

## 2016-03-31 MED ORDER — PREGABALIN 50 MG PO CAPS: 50.0000 mg | ORAL_CAPSULE | Freq: Three times a day (TID) | ORAL | 2 refills | Status: DC
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
## 2016-04-25 ENCOUNTER — Other Ambulatory Visit: Payer: Self-pay | Admitting: Family Medicine

## 2016-04-25 DIAGNOSIS — K219 Gastro-esophageal reflux disease without esophagitis: Secondary | ICD-10-CM

## 2016-05-11 ENCOUNTER — Other Ambulatory Visit: Payer: Self-pay | Admitting: Family Medicine

## 2016-05-11 DIAGNOSIS — F410 Panic disorder [episodic paroxysmal anxiety] without agoraphobia: Secondary | ICD-10-CM

## 2016-05-12 ENCOUNTER — Ambulatory Visit: Payer: BC Managed Care – PPO | Admitting: Family Medicine

## 2016-05-14 ENCOUNTER — Ambulatory Visit (INDEPENDENT_AMBULATORY_CARE_PROVIDER_SITE_OTHER): Payer: BC Managed Care – PPO | Admitting: Family Medicine

## 2016-05-14 ENCOUNTER — Encounter: Payer: Self-pay | Admitting: Family Medicine

## 2016-05-14 VITALS — BP 158/84 | HR 90 | Temp 99.2°F | Resp 16 | Ht 64.0 in | Wt 133.5 lb

## 2016-05-14 DIAGNOSIS — F3132 Bipolar disorder, current episode depressed, moderate: Secondary | ICD-10-CM

## 2016-05-14 DIAGNOSIS — M5412 Radiculopathy, cervical region: Secondary | ICD-10-CM | POA: Diagnosis not present

## 2016-05-14 DIAGNOSIS — R030 Elevated blood-pressure reading, without diagnosis of hypertension: Secondary | ICD-10-CM | POA: Diagnosis not present

## 2016-05-14 MED ORDER — TRAMADOL HCL 50 MG PO TABS: 50.0000 mg | ORAL_TABLET | Freq: Three times a day (TID) | ORAL | 0 refills | Status: DC | PRN
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
## 2016-06-09 ENCOUNTER — Emergency Department: Payer: BC Managed Care – PPO

## 2016-06-09 ENCOUNTER — Emergency Department
Admission: EM | Admit: 2016-06-09 | Discharge: 2016-06-09 | Disposition: A | Discharge status: Home / Self Care | Payer: BC Managed Care – PPO | Attending: Emergency Medicine | Admitting: Emergency Medicine

## 2016-06-09 ENCOUNTER — Encounter: Payer: Self-pay | Admitting: Emergency Medicine

## 2016-06-09 DIAGNOSIS — Y999 Unspecified external cause status: Secondary | ICD-10-CM | POA: Diagnosis not present

## 2016-06-09 DIAGNOSIS — Y92410 Unspecified street and highway as the place of occurrence of the external cause: Secondary | ICD-10-CM | POA: Insufficient documentation

## 2016-06-09 DIAGNOSIS — Y9389 Activity, other specified: Secondary | ICD-10-CM | POA: Diagnosis not present

## 2016-06-09 DIAGNOSIS — F17210 Nicotine dependence, cigarettes, uncomplicated: Secondary | ICD-10-CM | POA: Insufficient documentation

## 2016-06-09 DIAGNOSIS — Z79899 Other long term (current) drug therapy: Secondary | ICD-10-CM | POA: Diagnosis not present

## 2016-06-09 DIAGNOSIS — S199XXA Unspecified injury of neck, initial encounter: Secondary | ICD-10-CM | POA: Diagnosis present

## 2016-06-09 DIAGNOSIS — S161XXA Strain of muscle, fascia and tendon at neck level, initial encounter: Secondary | ICD-10-CM | POA: Insufficient documentation

## 2016-06-09 MED ORDER — KETOROLAC TROMETHAMINE 10 MG PO TABS: 10.0000 mg | ORAL_TABLET | Freq: Three times a day (TID) | ORAL | 0 refills | Status: DC

## 2016-06-09 MED ORDER — KETOROLAC TROMETHAMINE 60 MG/2ML IM SOLN
30.0000 mg | Freq: Once | INTRAMUSCULAR | Status: AC
  Administered 2016-06-09: 30 mg via INTRAMUSCULAR
  Filled 2016-06-09: qty 2
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
## 2016-06-09 NOTE — ED Notes (Signed)
Pt ambulatory to toilet with steady gait noted.  
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
## 2016-06-09 NOTE — Discharge Instructions (Signed)
Your exam and x-ray are normal today. You can expect to have some soreness for the next few days. Take the prescription meds as directed. Follow-up with your provider as needed.

## 2016-06-17 ENCOUNTER — Encounter: Payer: Self-pay | Admitting: Family Medicine

## 2016-06-17 ENCOUNTER — Ambulatory Visit (INDEPENDENT_AMBULATORY_CARE_PROVIDER_SITE_OTHER): Payer: BC Managed Care – PPO | Admitting: Family Medicine

## 2016-06-17 ENCOUNTER — Other Ambulatory Visit: Payer: Self-pay | Admitting: Family Medicine

## 2016-06-17 VITALS — BP 142/82 | HR 82 | Temp 98.2°F | Resp 16 | Ht 64.0 in | Wt 133.3 lb

## 2016-06-17 DIAGNOSIS — M5412 Radiculopathy, cervical region: Secondary | ICD-10-CM

## 2016-06-17 DIAGNOSIS — F3132 Bipolar disorder, current episode depressed, moderate: Secondary | ICD-10-CM

## 2016-06-17 DIAGNOSIS — Z1211 Encounter for screening for malignant neoplasm of colon: Secondary | ICD-10-CM

## 2016-06-17 DIAGNOSIS — I10 Essential (primary) hypertension: Secondary | ICD-10-CM | POA: Diagnosis not present

## 2016-06-17 LAB — COMPLETE METABOLIC PANEL WITH GFR
ALT: 5 U/L — ABNORMAL LOW (ref 6–29)
AST: 13 U/L (ref 10–35)
Albumin: 4.5 g/dL (ref 3.6–5.1)
Alkaline Phosphatase: 54 U/L (ref 33–130)
BUN: 26 mg/dL — ABNORMAL HIGH (ref 7–25)
CO2: 28 mmol/L (ref 20–31)
Calcium: 9.5 mg/dL (ref 8.6–10.4)
Chloride: 106 mmol/L (ref 98–110)
Creat: 0.8 mg/dL (ref 0.50–1.05)
GFR, Est African American: >89 mL/min (ref >=60)
GFR, Est Non African American: 85 mL/min (ref >=60)
Glucose, Bld: 87 mg/dL (ref 65–99)
Potassium: 4.3 mmol/L (ref 3.5–5.3)
Sodium: 143 mmol/L (ref 135–146)
Total Bilirubin: 0.3 mg/dL (ref 0.2–1.2)
Total Protein: 6.7 g/dL (ref 6.1–8.1)

## 2016-06-17 LAB — LIPID PANEL
Cholesterol: 234 mg/dL — ABNORMAL HIGH (ref <200)
HDL: 72 mg/dL (ref >50)
LDL Cholesterol: 146 mg/dL — ABNORMAL HIGH (ref <100)
Total CHOL/HDL Ratio: 3.3 Ratio (ref <5.0)
Triglycerides: 80 mg/dL (ref <150)
VLDL: 16 mg/dL (ref <30)

## 2016-06-17 LAB — CBC WITH DIFFERENTIAL/PLATELET
Basophils Absolute: 0 cells/uL (ref 0–200)
Basophils Relative: 0 %
Eosinophils Absolute: 174 cells/uL (ref 15–500)
Eosinophils Relative: 2 %
HCT: 41.2 % (ref 35.0–45.0)
Hemoglobin: 13.7 g/dL (ref 11.7–15.5)
Lymphocytes Relative: 30 %
Lymphs Abs: 2610 cells/uL (ref 850–3900)
MCH: 30.4 pg (ref 27.0–33.0)
MCHC: 33.3 g/dL (ref 32.0–36.0)
MCV: 91.6 fL (ref 80.0–100.0)
MPV: 10.7 fL (ref 7.5–12.5)
Monocytes Absolute: 696 cells/uL (ref 200–950)
Monocytes Relative: 8 %
Neutro Abs: 5220 cells/uL (ref 1500–7800)
Neutrophils Relative %: 60 %
Platelets: 207 10*3/uL (ref 140–400)
RBC: 4.5 MIL/uL (ref 3.80–5.10)
RDW: 13.3 % (ref 11.0–15.0)
WBC: 8.7 10*3/uL (ref 3.8–10.8)

## 2016-06-17 MED ORDER — VALSARTAN 160 MG PO TABS: 160.0000 mg | ORAL_TABLET | Freq: Every day | ORAL | 1 refills | Status: DC
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
## 2016-06-17 NOTE — Addendum Note (Signed)
Addended by: Loistine Chance on: 06/17/2016 08:44 AM   Modules accepted: Orders

## 2016-06-18 LAB — HEPATITIS C ANTIBODY: HCV Ab: NEGATIVE

## 2016-06-18 LAB — HEMOGLOBIN A1C
Hgb A1c MFr Bld: 5.1 % (ref <5.7)
Mean Plasma Glucose: 100 mg/dL

## 2016-08-18 ENCOUNTER — Encounter: Payer: Self-pay | Admitting: Family Medicine

## 2016-08-18 ENCOUNTER — Ambulatory Visit (INDEPENDENT_AMBULATORY_CARE_PROVIDER_SITE_OTHER): Payer: BC Managed Care – PPO | Admitting: Family Medicine

## 2016-08-18 VITALS — BP 146/82 | HR 92 | Temp 98.2°F | Resp 16 | Ht 64.0 in | Wt 134.6 lb

## 2016-08-18 DIAGNOSIS — M797 Fibromyalgia: Secondary | ICD-10-CM

## 2016-08-18 DIAGNOSIS — F410 Panic disorder [episodic paroxysmal anxiety] without agoraphobia: Secondary | ICD-10-CM | POA: Diagnosis not present

## 2016-08-18 DIAGNOSIS — G43009 Migraine without aura, not intractable, without status migrainosus: Secondary | ICD-10-CM

## 2016-08-18 DIAGNOSIS — F3132 Bipolar disorder, current episode depressed, moderate: Secondary | ICD-10-CM

## 2016-08-18 DIAGNOSIS — R739 Hyperglycemia, unspecified: Secondary | ICD-10-CM

## 2016-08-18 DIAGNOSIS — F5001 Anorexia nervosa, restricting type: Secondary | ICD-10-CM

## 2016-08-18 DIAGNOSIS — I10 Essential (primary) hypertension: Secondary | ICD-10-CM | POA: Diagnosis not present

## 2016-08-18 DIAGNOSIS — M5412 Radiculopathy, cervical region: Secondary | ICD-10-CM

## 2016-08-18 MED ORDER — CLONAZEPAM 1 MG PO TABS: 1.0000 mg | ORAL_TABLET | Freq: Every day | ORAL | 0 refills | Status: DC | PRN

## 2016-08-18 MED ORDER — TRAMADOL HCL 50 MG PO TABS: 50.0000 mg | ORAL_TABLET | Freq: Three times a day (TID) | ORAL | 0 refills | Status: DC | PRN

## 2016-08-18 MED ORDER — FLUOXETINE HCL 90 MG PO CPDR: 90.0000 mg | DELAYED_RELEASE_CAPSULE | ORAL | 0 refills | Status: DC

## 2016-08-18 MED ORDER — VALSARTAN 160 MG PO TABS: 160.0000 mg | ORAL_TABLET | Freq: Every day | ORAL | 0 refills | Status: DC

## 2016-08-18 MED ORDER — CYCLOBENZAPRINE HCL 10 MG PO TABS: 10.0000 mg | ORAL_TABLET | Freq: Every day | ORAL | 0 refills | Status: DC

## 2016-08-18 MED ORDER — SUMATRIPTAN SUCCINATE 100 MG PO TABS: 100.0000 mg | ORAL_TABLET | ORAL | 0 refills | Status: DC | PRN

## 2016-08-18 MED ORDER — PREGABALIN 75 MG PO CAPS: 75.0000 mg | ORAL_CAPSULE | Freq: Two times a day (BID) | ORAL | 0 refills | Status: DC
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
## 2016-09-24 LAB — COLOGUARD: Cologuard: NEGATIVE

## 2016-09-27 ENCOUNTER — Emergency Department (HOSPITAL_COMMUNITY): Payer: BC Managed Care – PPO

## 2016-09-27 ENCOUNTER — Emergency Department (HOSPITAL_COMMUNITY)
Admission: EM | Admit: 2016-09-27 | Discharge: 2016-09-27 | Disposition: A | Discharge status: Home / Self Care | Payer: BC Managed Care – PPO | Attending: Emergency Medicine | Admitting: Emergency Medicine

## 2016-09-27 ENCOUNTER — Encounter (HOSPITAL_COMMUNITY): Payer: Self-pay | Admitting: Emergency Medicine

## 2016-09-27 DIAGNOSIS — Y939 Activity, unspecified: Secondary | ICD-10-CM | POA: Insufficient documentation

## 2016-09-27 DIAGNOSIS — F17290 Nicotine dependence, other tobacco product, uncomplicated: Secondary | ICD-10-CM | POA: Insufficient documentation

## 2016-09-27 DIAGNOSIS — M5030 Other cervical disc degeneration, unspecified cervical region: Secondary | ICD-10-CM

## 2016-09-27 DIAGNOSIS — S46812A Strain of other muscles, fascia and tendons at shoulder and upper arm level, left arm, initial encounter: Secondary | ICD-10-CM | POA: Insufficient documentation

## 2016-09-27 DIAGNOSIS — Z79899 Other long term (current) drug therapy: Secondary | ICD-10-CM | POA: Insufficient documentation

## 2016-09-27 DIAGNOSIS — Y999 Unspecified external cause status: Secondary | ICD-10-CM | POA: Diagnosis not present

## 2016-09-27 DIAGNOSIS — S199XXA Unspecified injury of neck, initial encounter: Secondary | ICD-10-CM | POA: Diagnosis present

## 2016-09-27 DIAGNOSIS — Y92410 Unspecified street and highway as the place of occurrence of the external cause: Secondary | ICD-10-CM | POA: Insufficient documentation

## 2016-09-27 MED ORDER — TRAMADOL HCL 50 MG PO TABS: 50.0000 mg | ORAL_TABLET | Freq: Four times a day (QID) | ORAL | 0 refills | Status: DC | PRN

## 2016-09-27 MED ORDER — ONDANSETRON HCL 4 MG PO TABS
4.0000 mg | ORAL_TABLET | Freq: Once | ORAL | Status: AC
  Administered 2016-09-27: 4 mg via ORAL
  Filled 2016-09-27: qty 1

## 2016-09-27 MED ORDER — KETOROLAC TROMETHAMINE 10 MG PO TABS
10.0000 mg | ORAL_TABLET | Freq: Once | ORAL | Status: AC
  Administered 2016-09-27: 10 mg via ORAL
  Filled 2016-09-27: qty 1

## 2016-09-27 MED ORDER — CYCLOBENZAPRINE HCL 10 MG PO TABS: 10.0000 mg | ORAL_TABLET | Freq: Three times a day (TID) | ORAL | 0 refills | Status: DC

## 2016-09-27 MED ORDER — DICLOFENAC SODIUM 75 MG PO TBEC: 75.0000 mg | DELAYED_RELEASE_TABLET | Freq: Two times a day (BID) | ORAL | 0 refills | Status: DC

## 2016-09-27 MED ORDER — HYDROCODONE-ACETAMINOPHEN 5-325 MG PO TABS
2.0000 | ORAL_TABLET | Freq: Once | ORAL | Status: AC
  Administered 2016-09-27: 2 via ORAL
  Filled 2016-09-27: qty 2

## 2016-09-27 MED ORDER — DIAZEPAM 5 MG PO TABS
10.0000 mg | ORAL_TABLET | Freq: Once | ORAL | Status: AC
  Administered 2016-09-27: 10 mg via ORAL
  Filled 2016-09-27: qty 2

## 2016-09-27 NOTE — ED Notes (Signed)
From radiology 

## 2016-09-27 NOTE — ED Notes (Signed)
Patient transported to X-ray 
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
## 2016-09-27 NOTE — Discharge Instructions (Signed)
Your vital signs within normal limits. X-ray of your cervical spine shows some degenerative changes above and below your surgical fusion site. Please discuss this with your primary physician, or your neurosurgeon on. Your examination suggest trapezius strain with spasm. Please use Flexeril 3 times daily, diclofenac 2 times daily with a meal. May use FOR more severe pain. This medication as well as the Flexeril may cause drowsiness, please do not use these medications when you are drinking alcohol, drive a vehicle, operating machinery, or participating in activities requiring concentration.
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
## 2016-09-30 ENCOUNTER — Encounter: Payer: Self-pay | Admitting: Family Medicine

## 2016-10-16 ENCOUNTER — Other Ambulatory Visit: Payer: Self-pay | Admitting: Neurosurgery

## 2016-10-16 DIAGNOSIS — Z981 Arthrodesis status: Secondary | ICD-10-CM

## 2016-10-22 ENCOUNTER — Ambulatory Visit
Admission: RE | Admit: 2016-10-22 | Discharge: 2016-10-22 | Disposition: A | Discharge status: Home / Self Care | Payer: BC Managed Care – PPO | Source: Ambulatory Visit | Attending: Neurosurgery | Admitting: Neurosurgery

## 2016-10-22 DIAGNOSIS — M50223 Other cervical disc displacement at C6-C7 level: Secondary | ICD-10-CM | POA: Diagnosis not present

## 2016-10-22 DIAGNOSIS — M4802 Spinal stenosis, cervical region: Secondary | ICD-10-CM | POA: Diagnosis not present

## 2016-10-22 DIAGNOSIS — M2578 Osteophyte, vertebrae: Secondary | ICD-10-CM | POA: Diagnosis not present

## 2016-10-22 DIAGNOSIS — Z981 Arthrodesis status: Secondary | ICD-10-CM

## 2016-11-11 ENCOUNTER — Other Ambulatory Visit: Payer: Self-pay | Admitting: Family Medicine

## 2016-11-11 ENCOUNTER — Encounter: Payer: Self-pay | Admitting: Family Medicine

## 2016-11-11 ENCOUNTER — Ambulatory Visit (INDEPENDENT_AMBULATORY_CARE_PROVIDER_SITE_OTHER): Payer: BC Managed Care – PPO | Admitting: Family Medicine

## 2016-11-11 VITALS — BP 132/78 | HR 115 | Temp 98.4°F | Resp 18 | Ht 64.0 in | Wt 135.5 lb

## 2016-11-11 DIAGNOSIS — G8929 Other chronic pain: Secondary | ICD-10-CM

## 2016-11-11 DIAGNOSIS — F5001 Anorexia nervosa, restricting type: Secondary | ICD-10-CM

## 2016-11-11 DIAGNOSIS — I10 Essential (primary) hypertension: Secondary | ICD-10-CM

## 2016-11-11 DIAGNOSIS — F3132 Bipolar disorder, current episode depressed, moderate: Secondary | ICD-10-CM | POA: Diagnosis not present

## 2016-11-11 DIAGNOSIS — F410 Panic disorder [episodic paroxysmal anxiety] without agoraphobia: Secondary | ICD-10-CM

## 2016-11-11 DIAGNOSIS — M542 Cervicalgia: Secondary | ICD-10-CM | POA: Diagnosis not present

## 2016-11-11 DIAGNOSIS — R739 Hyperglycemia, unspecified: Secondary | ICD-10-CM

## 2016-11-11 DIAGNOSIS — M797 Fibromyalgia: Secondary | ICD-10-CM

## 2016-11-11 DIAGNOSIS — M5412 Radiculopathy, cervical region: Secondary | ICD-10-CM

## 2016-11-11 MED ORDER — CLONAZEPAM 1 MG PO TABS: 1.0000 mg | ORAL_TABLET | Freq: Every day | ORAL | 0 refills | Status: DC | PRN

## 2016-11-11 MED ORDER — MIRTAZAPINE 15 MG PO TBDP: 15.0000 mg | ORAL_TABLET | Freq: Every day | ORAL | 2 refills | Status: DC

## 2016-11-11 MED ORDER — BUPRENORPHINE 7.5 MCG/HR TD PTWK: 7.5000 mg | MEDICATED_PATCH | TRANSDERMAL | 0 refills | Status: DC

## 2016-11-11 MED ORDER — PREGABALIN 75 MG PO CAPS: 75.0000 mg | ORAL_CAPSULE | Freq: Two times a day (BID) | ORAL | 0 refills | Status: DC

## 2016-11-11 MED ORDER — VALSARTAN 160 MG PO TABS: 160.0000 mg | ORAL_TABLET | Freq: Every day | ORAL | 0 refills | Status: DC

## 2016-11-11 MED ORDER — FLUOXETINE HCL 90 MG PO CPDR: 90.0000 mg | DELAYED_RELEASE_CAPSULE | ORAL | 0 refills | Status: DC
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
## 2016-11-11 NOTE — Telephone Encounter (Signed)
Patient requesting refill of Prozac weekly to [facility].

## 2016-11-11 NOTE — Addendum Note (Signed)
Addended by: Steele Sizer F on: 11/11/2016 01:46 PM   Modules accepted: Orders

## 2016-11-12 ENCOUNTER — Telehealth: Payer: Self-pay

## 2016-11-12 MED ORDER — FLUOXETINE HCL 40 MG PO CAPS: 40.0000 mg | ORAL_CAPSULE | Freq: Every day | ORAL | 3 refills | Status: DC

## 2016-11-12 NOTE — Telephone Encounter (Signed)
Patient states Prozac weekly 90 mg was going to cost her $170 a month and Dr. Ancil Boozer stated she could switch to the Prozac 40 mg once daily due to cost.

## 2016-11-22 ENCOUNTER — Other Ambulatory Visit: Payer: Self-pay | Admitting: Family Medicine

## 2016-11-22 DIAGNOSIS — M797 Fibromyalgia: Secondary | ICD-10-CM

## 2016-11-26 ENCOUNTER — Other Ambulatory Visit: Payer: Self-pay | Admitting: Family Medicine

## 2016-11-26 ENCOUNTER — Telehealth: Payer: Self-pay | Admitting: Family Medicine

## 2016-11-26 DIAGNOSIS — I10 Essential (primary) hypertension: Secondary | ICD-10-CM

## 2016-11-26 NOTE — Telephone Encounter (Signed)
PT SAID THAT YOU AND HER TALKED ABOUT YOU SENDING HER TO A PAIN MANAGEMENT DR BUT HAS NOT HAD A REFERRAL PLACED IN. COULD YOU PLEASE PLACE THIS ORDER IN SO THAT IT CAN BE DONE BY [facility]. SHE WANT TO GO TO [facility] PAIN [facility]NA[facility]. WHICH EVER ONE GETS HER IN THE FASTEST FOR SHE IS IN A LOT OF PAIN.

## 2016-11-26 NOTE — Telephone Encounter (Signed)
PT SAID THAT THE PHARM HAD FORGOT TO SED A REQUEST FOR HER BLOOD PRESSURE MEDS BUT NOW THEY ARE RECALLING IT AND SHE HAS NOT TAKEN HER BLOOD PRESSURE MEDS SINCE Sunday. SHE WAS AT ANOTHER DR OFFICE AND HER BLOOD PRESSURE IS UP AND SHE NEEDS A REFILL ON THIS . [place].

## 2016-11-27 ENCOUNTER — Other Ambulatory Visit: Payer: Self-pay | Admitting: Family Medicine

## 2016-11-27 MED ORDER — OLMESARTAN MEDOXOMIL 40 MG PO TABS: 40.0000 mg | ORAL_TABLET | Freq: Every day | ORAL | 1 refills | Status: DC

## 2016-11-27 NOTE — Telephone Encounter (Signed)
Left a voicemail to notify patient of the change in medication and it is at her pharmacy.

## 2016-12-01 DIAGNOSIS — Z981 Arthrodesis status: Secondary | ICD-10-CM | POA: Insufficient documentation

## 2017-03-17 ENCOUNTER — Encounter: Payer: Self-pay | Admitting: Family Medicine

## 2017-03-17 ENCOUNTER — Ambulatory Visit: Payer: BC Managed Care – PPO | Admitting: Family Medicine

## 2017-03-17 VITALS — BP 134/72 | HR 87 | Temp 97.7°F | Resp 16 | Ht 64.0 in | Wt 137.1 lb

## 2017-03-17 DIAGNOSIS — G8929 Other chronic pain: Secondary | ICD-10-CM | POA: Diagnosis not present

## 2017-03-17 DIAGNOSIS — Z1239 Encounter for other screening for malignant neoplasm of breast: Secondary | ICD-10-CM

## 2017-03-17 DIAGNOSIS — F3132 Bipolar disorder, current episode depressed, moderate: Secondary | ICD-10-CM

## 2017-03-17 DIAGNOSIS — G43009 Migraine without aura, not intractable, without status migrainosus: Secondary | ICD-10-CM

## 2017-03-17 DIAGNOSIS — I10 Essential (primary) hypertension: Secondary | ICD-10-CM | POA: Diagnosis not present

## 2017-03-17 DIAGNOSIS — M797 Fibromyalgia: Secondary | ICD-10-CM

## 2017-03-17 DIAGNOSIS — M5412 Radiculopathy, cervical region: Secondary | ICD-10-CM

## 2017-03-17 DIAGNOSIS — Z01419 Encounter for gynecological examination (general) (routine) without abnormal findings: Secondary | ICD-10-CM | POA: Diagnosis not present

## 2017-03-17 DIAGNOSIS — R739 Hyperglycemia, unspecified: Secondary | ICD-10-CM

## 2017-03-17 DIAGNOSIS — M542 Cervicalgia: Secondary | ICD-10-CM | POA: Diagnosis not present

## 2017-03-17 DIAGNOSIS — F5001 Anorexia nervosa, restricting type: Secondary | ICD-10-CM

## 2017-03-17 DIAGNOSIS — F410 Panic disorder [episodic paroxysmal anxiety] without agoraphobia: Secondary | ICD-10-CM

## 2017-03-17 MED ORDER — SUMATRIPTAN SUCCINATE 100 MG PO TABS: 100.0000 mg | ORAL_TABLET | ORAL | 0 refills | Status: DC | PRN

## 2017-03-17 MED ORDER — CLONAZEPAM 1 MG PO TABS: 1.0000 mg | ORAL_TABLET | Freq: Every day | ORAL | 0 refills | Status: DC | PRN

## 2017-03-17 MED ORDER — OLMESARTAN MEDOXOMIL 40 MG PO TABS: 40.0000 mg | ORAL_TABLET | Freq: Every day | ORAL | 1 refills | Status: DC

## 2017-03-17 MED ORDER — TRAMADOL HCL 50 MG PO TABS: 50.0000 mg | ORAL_TABLET | Freq: Two times a day (BID) | ORAL | 2 refills | Status: DC | PRN

## 2017-03-17 MED ORDER — CYCLOBENZAPRINE HCL 10 MG PO TABS: 10.0000 mg | ORAL_TABLET | Freq: Every day | ORAL | 1 refills | Status: DC

## 2017-03-17 MED ORDER — PREGABALIN 75 MG PO CAPS: 75.0000 mg | ORAL_CAPSULE | Freq: Two times a day (BID) | ORAL | 0 refills | Status: DC

## 2017-03-17 NOTE — Patient Instructions (Signed)
Preventive Care 40-64 Years, Female Preventive care refers to lifestyle choices and visits with your health care provider that can promote health and wellness. What does preventive care include?  A yearly physical exam. This is also called an annual well check.  Dental exams once or twice a year.  Routine eye exams. Ask your health care provider how often you should have your eyes checked.  Personal lifestyle choices, including: ? Daily care of your teeth and gums. ? Regular physical activity. ? Eating a healthy diet. ? Avoiding tobacco and drug use. ? Limiting alcohol use. ? Practicing safe sex. ? Taking low-dose aspirin daily starting at age 58. ? Taking vitamin and mineral supplements as recommended by your health care provider. What happens during an annual well check? The services and screenings done by your health care provider during your annual well check will depend on your age, overall health, lifestyle risk factors, and family history of disease. Counseling Your health care provider may ask you questions about your:  Alcohol use.  Tobacco use.  Drug use.  Emotional well-being.  Home and relationship well-being.  Sexual activity.  Eating habits.  Work and work Statistician.  Method of birth control.  Menstrual cycle.  Pregnancy history.  Screening You may have the following tests or measurements:  Height, weight, and BMI.  Blood pressure.  Lipid and cholesterol levels. These may be checked every 5 years, or more frequently if you are over 81 years old.  Skin check.  Lung cancer screening. You may have this screening every year starting at age 78 if you have a 30-pack-year history of smoking and currently smoke or have quit within the past 15 years.  Fecal occult blood test (FOBT) of the stool. You may have this test every year starting at age 65.  Flexible sigmoidoscopy or colonoscopy. You may have a sigmoidoscopy every 5 years or a colonoscopy  every 10 years starting at age 30.  Hepatitis C blood test.  Hepatitis B blood test.  Sexually transmitted disease (STD) testing.  Diabetes screening. This is done by checking your blood sugar (glucose) after you have not eaten for a while (fasting). You may have this done every 1-3 years.  Mammogram. This may be done every 1-2 years. Talk to your health care provider about when you should start having regular mammograms. This may depend on whether you have a family history of breast cancer.  BRCA-related cancer screening. This may be done if you have a family history of breast, ovarian, tubal, or peritoneal cancers.  Pelvic exam and Pap test. This may be done every 3 years starting at age 80. Starting at age 36, this may be done every 5 years if you have a Pap test in combination with an HPV test.  Bone density scan. This is done to screen for osteoporosis. You may have this scan if you are at high risk for osteoporosis.  Discuss your test results, treatment options, and if necessary, the need for more tests with your health care provider. Vaccines Your health care provider may recommend certain vaccines, such as:  Influenza vaccine. This is recommended every year.  Tetanus, diphtheria, and acellular pertussis (Tdap, Td) vaccine. You may need a Td booster every 10 years.  Varicella vaccine. You may need this if you have not been vaccinated.  Zoster vaccine. You may need this after age 5.  Measles, mumps, and rubella (MMR) vaccine. You may need at least one dose of MMR if you were born in  1957 or later. You may also need a second dose.  Pneumococcal 13-valent conjugate (PCV13) vaccine. You may need this if you have certain conditions and were not previously vaccinated.  Pneumococcal polysaccharide (PPSV23) vaccine. You may need one or two doses if you smoke cigarettes or if you have certain conditions.  Meningococcal vaccine. You may need this if you have certain  conditions.  Hepatitis A vaccine. You may need this if you have certain conditions or if you travel or work in places where you may be exposed to hepatitis A.  Hepatitis B vaccine. You may need this if you have certain conditions or if you travel or work in places where you may be exposed to hepatitis B.  Haemophilus influenzae type b (Hib) vaccine. You may need this if you have certain conditions.  Talk to your health care provider about which screenings and vaccines you need and how often you need them. This information is not intended to replace advice given to you by your health care provider. Make sure you discuss any questions you have with your health care provider. Document Released: 05/17/2015 Document Revised: 01/08/2016 Document Reviewed: 02/19/2015 Elsevier Interactive Patient Education  2017 [employer].
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
## 2017-03-24 ENCOUNTER — Telehealth: Payer: Self-pay | Admitting: Family Medicine

## 2017-03-24 NOTE — Telephone Encounter (Signed)
Copied from [place]. Topic: Referral - Status >> Mar 24, 2017  2:24 PM Cleaster Corin, [place] wrote: CRM for notification. See Telephone encounter for:   03/24/17.   Reason for CRM: pt. Called to see if her referral has been sent to the pain clinic yet. Give pt a call to let her know when done [phone]

## 2017-03-26 NOTE — Telephone Encounter (Signed)
I tried to contact this patient to inform her that the referral to the pain clinic has already been sent but no appt has been made at this time. I stated that it could be in the review process with Dr. Gillis Santa but that she could contact his office to check the status. Their office number [phone]) and address ([place]) was given.  I also told her to give [place] a call back if she prefers to go to another facility.

## 2017-04-20 ENCOUNTER — Ambulatory Visit: Payer: BC Managed Care – PPO | Admitting: Student in an Organized Health Care Education/Training Program

## 2017-04-30 ENCOUNTER — Ambulatory Visit: Payer: Self-pay | Admitting: *Deleted

## 2017-04-30 NOTE — Telephone Encounter (Signed)
Pt called to report feeling "Overwhelmed" Increased depression since death of father 04-14-2023 and current hospitalization of husband. Husbands condition deteriorated today which prompted call. Taking Prozac as prescribed. Able to work; states supportive family locally she sees often and can speak openly with them. Decreased appetite, able to sleep at night.  No suicidal ideation. Appt made by agent prior to triage; will see Dr. Ancil Boozer 05/06/17. Pt will only see Dr. Ancil Boozer. Would like  referral to therapist. Advised to call back or go to ED if increased feelings of depression, sadness, suicidal ideation.  Reason for Disposition . Requesting to talk with a counselor (mental health worker, psychiatrist, etc.)  Answer Assessment - Initial Assessment Questions 1. CONCERN: "What happened that made you call today?"     Overwhelmed, "peeked today" when husband's condition deteriorated. He is currently in hospital.  2. DEPRESSION SYMPTOM SCREENING: "How are you feeling overall?" (e.g., decreased energy, increased sleeping or difficulty sleeping, difficulty concentrating, feelings of sadness, guilt, hopelessness, or worthlessness)     "Overwhelmed" decreased energy, sadness, no appetite. 3. RISK OF HARM - SUICIDAL IDEATION:  "Do you ever have thoughts of hurting or killing yourself?"  (e.g., yes, no, no but preoccupation with thoughts about death)   - INTENT:  "Do you have thoughts of hurting or killing yourself right NOW?" (e.g., yes, no, N/A)   - PLAN: "Do you have a specific plan for how you would do this?" (e.g., gun, knife, overdose, no plan, N/A)     no 4. RISK OF HARM - HOMICIDAL IDEATION:  "Do you ever have thoughts of hurting or killing someone else?"  (e.g., yes, no, no but preoccupation with thoughts about death)   - INTENT:  "Do you have thoughts of hurting or killing someone right NOW?" (e.g., yes, no, N/A)   - PLAN: "Do you have a specific plan for how you would do this?" (e.g., gun, knife, no plan,  N/A)      no 5. FUNCTIONAL IMPAIRMENT: "How have things been going for you overall in your life? Have you had any more difficulties than usual doing your normal daily activities?"  (e.g., better, same, worse; self-care, school, work, interactions)     Fathers death, husbands hospitalization, financial burderns 6. SUPPORT: "Who is with you now?" "Who do you live with?" "Do you have family or friends nearby who you can talk to?"      Family in the area, supportive 7. THERAPIST: "Do you have a counselor or therapist? Name?"     No 8. STRESSORS: "Has there been any new stress or recent changes in your life?"     Multiple 9. DRUG ABUSE/ALCOHOL: "Do you drink alcohol or use any illegal drugs?"    Drinks alcohol daily. "3 shots of liquor a day." 10. OTHER: "Do you have any other health or medical symptoms right now?" (e.g., fever)       no  Protocols used: DEPRESSION-A-AH

## 2017-05-06 ENCOUNTER — Ambulatory Visit: Payer: BC Managed Care – PPO | Admitting: Family Medicine

## 2017-05-26 ENCOUNTER — Encounter: Payer: Self-pay | Admitting: Family Medicine

## 2017-05-26 ENCOUNTER — Ambulatory Visit: Payer: BC Managed Care – PPO | Admitting: Family Medicine

## 2017-05-26 VITALS — BP 148/86 | HR 101 | Temp 97.9°F | Resp 18 | Ht 64.0 in | Wt 136.1 lb

## 2017-05-26 DIAGNOSIS — M5412 Radiculopathy, cervical region: Secondary | ICD-10-CM | POA: Diagnosis not present

## 2017-05-26 DIAGNOSIS — F3132 Bipolar disorder, current episode depressed, moderate: Secondary | ICD-10-CM

## 2017-05-26 DIAGNOSIS — M542 Cervicalgia: Secondary | ICD-10-CM | POA: Diagnosis not present

## 2017-05-26 DIAGNOSIS — M797 Fibromyalgia: Secondary | ICD-10-CM

## 2017-05-26 DIAGNOSIS — Z741 Need for assistance with personal care: Secondary | ICD-10-CM | POA: Diagnosis not present

## 2017-05-26 DIAGNOSIS — F5001 Anorexia nervosa, restricting type: Secondary | ICD-10-CM | POA: Diagnosis not present

## 2017-05-26 DIAGNOSIS — G43009 Migraine without aura, not intractable, without status migrainosus: Secondary | ICD-10-CM | POA: Diagnosis not present

## 2017-05-26 DIAGNOSIS — G8929 Other chronic pain: Secondary | ICD-10-CM | POA: Diagnosis not present

## 2017-05-26 DIAGNOSIS — I10 Essential (primary) hypertension: Secondary | ICD-10-CM | POA: Diagnosis not present

## 2017-05-26 MED ORDER — MIRTAZAPINE 15 MG PO TBDP: 15.0000 mg | ORAL_TABLET | Freq: Every day | ORAL | 2 refills | Status: DC

## 2017-05-26 MED ORDER — TRAMADOL HCL 50 MG PO TABS: 50.0000 mg | ORAL_TABLET | Freq: Two times a day (BID) | ORAL | 2 refills | Status: DC | PRN

## 2017-05-26 MED ORDER — ARIPIPRAZOLE 5 MG PO TABS: 5.0000 mg | ORAL_TABLET | Freq: Every day | ORAL | 2 refills | Status: DC

## 2017-05-26 MED ORDER — PREGABALIN 75 MG PO CAPS: 75.0000 mg | ORAL_CAPSULE | Freq: Two times a day (BID) | ORAL | 0 refills | Status: DC
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
## 2017-06-29 ENCOUNTER — Encounter: Payer: Self-pay | Admitting: Family Medicine

## 2017-06-29 ENCOUNTER — Ambulatory Visit: Payer: BC Managed Care – PPO | Admitting: Family Medicine

## 2017-06-29 VITALS — BP 162/92 | HR 80 | Temp 98.2°F | Resp 18 | Ht 64.0 in | Wt 138.5 lb

## 2017-06-29 DIAGNOSIS — F3130 Bipolar disorder, current episode depressed, mild or moderate severity, unspecified: Secondary | ICD-10-CM | POA: Diagnosis not present

## 2017-06-29 DIAGNOSIS — F410 Panic disorder [episodic paroxysmal anxiety] without agoraphobia: Secondary | ICD-10-CM

## 2017-06-29 DIAGNOSIS — I10 Essential (primary) hypertension: Secondary | ICD-10-CM

## 2017-06-29 DIAGNOSIS — F319 Bipolar disorder, unspecified: Secondary | ICD-10-CM

## 2017-06-29 MED ORDER — QUETIAPINE FUMARATE 50 MG PO TABS: 50.0000 mg | ORAL_TABLET | Freq: Every day | ORAL | 0 refills | Status: DC

## 2017-06-29 MED ORDER — AMLODIPINE BESYLATE 2.5 MG PO TABS: 2.5000 mg | ORAL_TABLET | Freq: Every day | ORAL | 0 refills | Status: DC

## 2017-06-29 MED ORDER — CLONAZEPAM 0.5 MG PO TABS: 0.5000 mg | ORAL_TABLET | Freq: Every day | ORAL | 0 refills | Status: DC | PRN
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
## 2017-07-13 ENCOUNTER — Other Ambulatory Visit: Payer: Self-pay | Admitting: Family Medicine

## 2017-07-28 ENCOUNTER — Encounter: Payer: Self-pay | Admitting: Family Medicine

## 2017-08-02 ENCOUNTER — Encounter: Payer: Self-pay | Admitting: Family Medicine

## 2017-08-02 ENCOUNTER — Ambulatory Visit: Payer: BC Managed Care – PPO | Admitting: Family Medicine

## 2017-08-02 VITALS — BP 140/100 | HR 89 | Resp 14 | Ht 64.0 in | Wt 142.8 lb

## 2017-08-02 DIAGNOSIS — I10 Essential (primary) hypertension: Secondary | ICD-10-CM

## 2017-08-02 DIAGNOSIS — F319 Bipolar disorder, unspecified: Secondary | ICD-10-CM | POA: Diagnosis not present

## 2017-08-02 MED ORDER — AMLODIPINE BESYLATE 5 MG PO TABS: 5.0000 mg | ORAL_TABLET | Freq: Every day | ORAL | 0 refills | Status: DC
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
## 2017-09-01 ENCOUNTER — Encounter: Payer: Self-pay | Admitting: Nurse Practitioner

## 2017-09-01 ENCOUNTER — Ambulatory Visit: Payer: BC Managed Care – PPO | Admitting: Nurse Practitioner

## 2017-09-01 VITALS — BP 126/78 | HR 102 | Temp 98.8°F | Resp 16 | Ht 64.0 in | Wt 143.3 lb

## 2017-09-01 DIAGNOSIS — I10 Essential (primary) hypertension: Secondary | ICD-10-CM | POA: Insufficient documentation

## 2017-09-01 DIAGNOSIS — K219 Gastro-esophageal reflux disease without esophagitis: Secondary | ICD-10-CM

## 2017-09-01 DIAGNOSIS — Z720 Tobacco use: Secondary | ICD-10-CM | POA: Diagnosis not present

## 2017-09-01 DIAGNOSIS — M797 Fibromyalgia: Secondary | ICD-10-CM | POA: Diagnosis not present

## 2017-09-01 MED ORDER — PREGABALIN 50 MG PO CAPS: 50.0000 mg | ORAL_CAPSULE | Freq: Three times a day (TID) | ORAL | 2 refills | Status: DC

## 2017-09-01 MED ORDER — AMLODIPINE BESYLATE 10 MG PO TABS: 10.0000 mg | ORAL_TABLET | Freq: Every day | ORAL | 3 refills | Status: DC

## 2017-09-01 MED ORDER — PREGABALIN 50 MG PO CAPS
50.0000 mg | ORAL_CAPSULE | Freq: Three times a day (TID) | ORAL | 2 refills | Status: DC
Start: 2017-09-01 — End: 2018-02-10

## 2017-09-01 NOTE — Patient Instructions (Signed)
- Read about chantix and wellbutrin or nicotine replacement and let us know how we can help.   Steps to Quit Smoking Smoking tobacco can be bad for your health. It can also affect almost every organ in your body. Smoking puts you and people around you at risk for many serious long-lasting (chronic) diseases. Quitting smoking is hard, but it is one of the best things that you can do for your health. It is never too late to quit. What are the benefits of quitting smoking? When you quit smoking, you lower your risk for getting serious diseases and conditions. They can include:  Lung cancer or lung disease.  Heart disease.  Stroke.  Heart attack.  Not being able to have children (infertility).  Weak bones (osteoporosis) and broken bones (fractures).  If you have coughing, wheezing, and shortness of breath, those symptoms may get better when you quit. You may also get sick less often. If you are pregnant, quitting smoking can help to lower your chances of having a baby of low birth weight. What can I do to help me quit smoking? Talk with your doctor about what can help you quit smoking. Some things you can do (strategies) include:  Quitting smoking totally, instead of slowly cutting back how much you smoke over a period of time.  Going to in-person counseling. You are more likely to quit if you go to many counseling sessions.  Using resources and support systems, such as: ? Database administrator with a Social worker. ? Phone quitlines. ? Careers information officer. ? Support groups or group counseling. ? Text messaging programs. ? Mobile phone apps or applications.  Taking medicines. Some of these medicines may have nicotine in them. If you are pregnant or breastfeeding, do not take any medicines to quit smoking unless your doctor says it is okay. Talk with your doctor about counseling or other things that can help you.  Talk with your doctor about using more than one strategy at the same time,  such as taking medicines while you are also going to in-person counseling. This can help make quitting easier. What things can I do to make it easier to quit? Quitting smoking might feel very hard at first, but there is a lot that you can do to make it easier. Take these steps:  Talk to your family and friends. Ask them to support and encourage you.  Call phone quitlines, reach out to support groups, or work with a Social worker.  Ask people who smoke to not smoke around you.  Avoid places that make you want (trigger) to smoke, such as: ? Bars. ? Parties. ? Smoke-break areas at work.  Spend time with people who do not smoke.  Lower the stress in your life. Stress can make you want to smoke. Try these things to help your stress: ? Getting regular exercise. ? Deep-breathing exercises. ? Yoga. ? Meditating. ? Doing a body scan. To do this, close your eyes, focus on one area of your body at a time from head to toe, and notice which parts of your body are tense. Try to relax the muscles in those areas.  Download or buy apps on your mobile phone or tablet that can help you stick to your quit plan. There are many free apps, such as QuitGuide from the [employer] Office manager for Disease Control and Prevention). You can find more support from smokefree.gov and other websites.  This information is not intended to replace advice given to you by your health  care provider. Make sure you discuss any questions you have with your health care provider. Document Released: 02/14/2009 Document Revised: 12/17/2015 Document Reviewed: 09/04/2014 Elsevier Interactive Patient Education  2018 [employer] with Quitting Smoking Quitting smoking is a physical and mental challenge. You will face cravings, withdrawal symptoms, and temptation. Before quitting, work with your health care provider to make a plan that can help you cope. Preparation can help you quit and keep you from giving in. How can I cope with  cravings? Cravings usually last for 5-10 minutes. If you get through it, the craving will pass. Consider taking the following actions to help you cope with cravings:  Keep your mouth busy: ? Chew sugar-free gum. ? Suck on hard candies or a straw. ? Brush your teeth.  Keep your hands and body busy: ? Immediately change to a different activity when you feel a craving. ? Squeeze or play with a ball. ? Do an activity or a hobby, like making bead jewelry, practicing needlepoint, or working with wood. ? Mix up your normal routine. ? Take a short exercise break. Go for a quick walk or run up and down stairs. ? Spend time in public places where smoking is not allowed.  Focus on doing something kind or helpful for someone else.  Call a friend or family member to talk during a craving.  Join a support group.  Call a quit line, such as 1-800-QUIT-NOW.  Talk with your health care provider about medicines that might help you cope with cravings and make quitting easier for you.  How can I deal with withdrawal symptoms? Your body may experience negative effects as it tries to get used to not having nicotine in the system. These effects are called withdrawal symptoms. They may include:  Feeling hungrier than normal.  Trouble concentrating.  Irritability.  Trouble sleeping.  Feeling depressed.  Restlessness and agitation.  Craving a cigarette.  To manage withdrawal symptoms:  Avoid places, people, and activities that trigger your cravings.  Remember why you want to quit.  Get plenty of sleep.  Avoid coffee and other caffeinated drinks. These may worsen some of your symptoms.  How can I handle social situations? Social situations can be difficult when you are quitting smoking, especially in the first few weeks. To manage this, you can:  Avoid parties, bars, and other social situations where people might be smoking.  Avoid alcohol.  Leave right away if you have the urge to  smoke.  Explain to your family and friends that you are quitting smoking. Ask for understanding and support.  Plan activities with friends or family where smoking is not an option.  What are some ways I can cope with stress? Wanting to smoke may cause stress, and stress can make you want to smoke. Find ways to manage your stress. Relaxation techniques can help. For example:  Breathe slowly and deeply, in through your nose and out through your mouth.  Listen to soothing, relaxing music.  Talk with a family member or friend about your stress.  Light a candle.  Soak in a bath or take a shower.  Think about a peaceful place.  What are some ways I can prevent weight gain? Be aware that many people gain weight after they quit smoking. However, not everyone does. To keep from gaining weight, have a plan in place before you quit and stick to the plan after you quit. Your plan should include:  Having healthy snacks. When you have  a craving, it may help to: ? Eat plain popcorn, crunchy carrots, celery, or other cut vegetables. ? Chew sugar-free gum.  Changing how you eat: ? Eat small portion sizes at meals. ? Eat 4-6 small meals throughout the day instead of 1-2 large meals a day. ? Be mindful when you eat. Do not watch television or do other things that might distract you as you eat.  Exercising regularly: ? Make time to exercise each day. If you do not have time for a long workout, do short bouts of exercise for 5-10 minutes several times a day. ? Do some form of strengthening exercise, like weight lifting, and some form of aerobic exercise, like running or swimming.  Drinking plenty of water or other low-calorie or no-calorie drinks. Drink 6-8 glasses of water daily, or as much as instructed by your health care provider.  Summary  Quitting smoking is a physical and mental challenge. You will face cravings, withdrawal symptoms, and temptation to smoke again. Preparation can help you  as you go through these challenges.  You can cope with cravings by keeping your mouth busy (such as by chewing gum), keeping your body and hands busy, and making calls to family, friends, or a helpline for people who want to quit smoking.  You can cope with withdrawal symptoms by avoiding places where people smoke, avoiding drinks with caffeine, and getting plenty of rest.  Ask your health care provider about the different ways to prevent weight gain, avoid stress, and handle social situations. This information is not intended to replace advice given to you by your health care provider. Make sure you discuss any questions you have with your health care provider. Document Released: 04/17/2016 Document Revised: 04/17/2016 Document Reviewed: 04/17/2016 Elsevier Interactive Patient Education  [employer].

## 2017-09-01 NOTE — Progress Notes (Signed)
Name: Nancy Lucero   MRN: [id number]    DOB: December 30, 1965   Date:09/01/2017       Progress Note  Subjective  Chief Complaint  Chief Complaint  Patient presents with  . Hypertension    recheck BP  . Medication Problem    would like Lyrica changed to 50 mg tid from 75 mg Bid. The 75 mg makes her stutter.    HPI  Was seen by PCP Dr. Ancil Boozer on 4/1 for routine follow up and noted to be hypertensive at 140/100- her amlodipine was increased to 10mg . Blood pressure well controlled today. Pt endorses left leg swelling- none appreciated sts decreases when elevated. Denies headaches and blurry vision.   Patient requesting lyrica 50mg  TID states 75mg  dose makes her stutter- has been going on for a few months for hours after taking medication. Patient taking for neck pain goes down spine and shoulders sts moderately controlled with lyrica.   Occasional chest pain central burning, chronic relieved with indigestion medicine. Pt rx omeprazole   Pt smokes at least a pack a day states she has quit before but has been stressed recently and picked it back up. Pt sts she is willing to try to quit again.   Patient Active Problem List   Diagnosis Date Noted  . S/P cervical spinal fusion 12/01/2016  . MVP (mitral valve prolapse) 08/28/2015  . Dyslipidemia 08/28/2015  . Chronic back pain 10/20/2014  . Cervical radiculitis 10/20/2014  . Anorexia nervosa, restricting type 10/20/2014  . Fibromyalgia syndrome 10/20/2014  . Fibrocystic breast disease (FCBD) in female 10/20/2014  . Gastro-esophageal reflux disease without esophagitis 10/20/2014  . Irritable bowel syndrome (IBS) 10/20/2014  . Migraine without aura and responsive to treatment 10/20/2014  . Panic attack 10/20/2014  . Spondylosis 10/20/2014  . Tobacco use 10/20/2014  . Mixed urge and stress incontinence 10/20/2014  . Atrophy of vagina 10/20/2014    Past Medical History:  Diagnosis Date  . Atrophic vaginitis   . Backache   . Bipolar  affective ([place])   . Bipolar disorder current episode depressed ([place]) 10/20/2014  . Fibromyalgia   . GERD (gastroesophageal reflux disease)   . Hypoglycemia   . Irritable bowel syndrome with diarrhea   . Migraine without aura with status migrainosus     Past Surgical History:  Procedure Laterality Date  . ABDOMINAL HYSTERECTOMY    . APPENDECTOMY    . KNEE SURGERY Right 10/15/2015   [facility]-removed meniscus  . SPINAL FUSION     Neck 3/4/5  . TONSILLECTOMY    . TOOTH EXTRACTION  02/03/2016    Social History   Tobacco Use  . Smoking status: Current Some Day Smoker    Years: 39.00    Types: E-cigarettes, Cigarettes    Start date: 05/04/1977  . Smokeless tobacco: Never Used  . Tobacco comment: patient states she has quit several times but then she starts again  Substance Use Topics  . Alcohol use: Yes    Alcohol/week: 4.2 oz    Types: 7 Standard drinks or equivalent per week    Comment: glass of wine/or 2 oz of alcohol every evening     Current Outpatient Medications:  .  amLODipine (NORVASC) 5 MG tablet, Take 1 tablet (5 mg total) by mouth daily., Disp: 30 tablet, Rfl: 0 .  aspirin EC 81 MG tablet, Take 81 mg by mouth., Disp: , Rfl:  .  b complex vitamins capsule, Take by mouth., Disp: , Rfl:  .  clonazePAM (KLONOPIN)  0.5 MG tablet, Take 1 tablet (0.5 mg total) by mouth daily as needed for anxiety., Disp: 30 tablet, Rfl: 0 .  cyclobenzaprine (FLEXERIL) 10 MG tablet, Take 1 tablet (10 mg total) at bedtime by mouth., Disp: 90 tablet, Rfl: 1 .  FIBER PO, 1 serving daily., Disp: , Rfl:  .  FLUoxetine (PROZAC) 40 MG capsule, Take 1 capsule (40 mg total) by mouth daily., Disp: 90 capsule, Rfl: 3 .  mirtazapine (REMERON SOL-TAB) 15 MG disintegrating tablet, Take 1 tablet (15 mg total) by mouth at bedtime. Up to two daily for anorexia, Disp: 60 tablet, Rfl: 2 .  Nutritional Supplements (JUICE PLUS FIBRE PO), Take 1 capsule by mouth daily., Disp: , Rfl:  .  olmesartan (BENICAR) 40 MG  tablet, Take 1 tablet (40 mg total) daily by mouth., Disp: 90 tablet, Rfl: 1 .  Omeprazole 20 MG TBEC, TAKE 1 TABLET BY MOUTH EVERY DAY, Disp: 84 tablet, Rfl: 0 .  pregabalin (LYRICA) 75 MG capsule, Take 1 capsule (75 mg total) by mouth 2 (two) times daily., Disp: 180 capsule, Rfl: 0 .  Probiotic TBEC, Take by mouth., Disp: , Rfl:  .  QUEtiapine (SEROQUEL) 50 MG tablet, Take 1-3 tablets (50-150 mg total) by mouth at bedtime., Disp: 90 tablet, Rfl: 0 .  SUMAtriptan (IMITREX) 100 MG tablet, Take 1 tablet (100 mg total) every 2 (two) hours as needed by mouth for migraine. May repeat in 2 hours if headache persists or recurs., Disp: 10 tablet, Rfl: 0 .  traMADol (ULTRAM) 50 MG tablet, Take 1 tablet (50 mg total) by mouth every 12 (twelve) hours as needed., Disp: 60 tablet, Rfl: 2 .  calcium citrate-vitamin D (CITRACAL+D) 315-200 MG-UNIT tablet, Take by mouth., Disp: , Rfl:   Allergies  Allergen Reactions  . Cephalosporins Anaphylaxis  . Cortisone     blackouts  . Cephalexin   . Penicillins   . Sulfa Antibiotics   . Sulfacetamide Sodium   . Fentanyl Nausea And Vomiting    blindness    ROS  Constitutional: Negative for fever or weight change.  Respiratory: Negative for cough and shortness of breath.   Cardiovascular: Endorses chest pain relieved with indigestion medications denies  palpitations.  Neurological: Negative dizziness, headache No other specific complaints in a complete review of systems (except as listed in HPI above).  Objective  Vitals:   09/01/17 1536  BP: 126/78  Pulse: (!) 102  Resp: 16  Temp: 98.8 F (37.1 C)  TempSrc: Oral  SpO2: 96%  Weight: 143 lb 4.8 oz (65 kg)  Height: 5\' 4"  (1.626 m)    Body mass index is 24.6 kg/m.  Nursing Note and Vital Signs reviewed.  Physical Exam  Constitutional: Patient appears well-developed and well-nourished.  No distress.  Cardiovascular: Normal rate, regular rhythm, S1/S2 present.  No murmur or rub heard.   Pulmonary/Chest: Effort normal and breath sounds clear. No respiratory distress or retractions. Psychiatric: Patient has a normal mood and affect. behavior is normal. Judgment and thought content normal.  No results found for this or any previous visit (from the past 72 hour(s)).  Assessment & Plan  1. Hypertension, benign - amLODipine (NORVASC) 10 MG tablet; Take 1 tablet (10 mg total) by mouth daily.  Dispense: 90 tablet; Refill: 3  2. Gastro-esophageal reflux disease without esophagitis -avoid triggers, continue medications   3. Fibromyalgia syndrome - pregabalin (LYRICA) 50 MG capsule; Take 1 capsule (50 mg total) by mouth 3 (three) times daily.  Dispense: 90 capsule; Refill:  2  4. Tobacco use Counseled on smoking cessation will read about medications and let us know if she would like to try one or nicotine replacement     -Red flags and when to present for emergency care or RTC including fever >101.88F, chest pain, shortness of breath, new/worsening/un-resolving symptoms,  reviewed with patient at time of visit. Follow up and care instructions discussed and provided in AVS.

## 2017-09-09 ENCOUNTER — Other Ambulatory Visit: Payer: Self-pay | Admitting: Family Medicine

## 2017-09-09 DIAGNOSIS — I10 Essential (primary) hypertension: Secondary | ICD-10-CM

## 2017-10-28 ENCOUNTER — Encounter: Payer: Self-pay | Admitting: Nurse Practitioner

## 2017-10-28 ENCOUNTER — Ambulatory Visit: Payer: BC Managed Care – PPO | Admitting: Nurse Practitioner

## 2017-10-28 VITALS — BP 124/82 | HR 100 | Temp 98.4°F | Resp 16 | Ht 64.0 in | Wt 135.5 lb

## 2017-10-28 DIAGNOSIS — M546 Pain in thoracic spine: Secondary | ICD-10-CM

## 2017-10-28 MED ORDER — LIDO-CAPSAICIN-MEN-METHYL SAL 0.5-0.035-5-20 % EX PTCH: 1.0000 | MEDICATED_PATCH | Freq: Every day | CUTANEOUS | 2 refills | Status: DC

## 2017-10-28 MED ORDER — MELOXICAM 7.5 MG PO TABS: 7.5000 mg | ORAL_TABLET | Freq: Every day | ORAL | 0 refills | Status: DC

## 2017-10-28 NOTE — Progress Notes (Addendum)
lidoName: Nancy Lucero   MRN: [id number]    DOB: [birth year]   Date:10/28/2017       Progress Note  Subjective  Chief Complaint  Chief Complaint  Patient presents with  . Back Pain    worse in the pass 2 months    HPI  Patient states started with left shoulder pain 2 months ago then radiated down to mid-back. 2 weeks ago pain on left side pain that caused her to fall multiple pain- states used heating pad, 10mg  of toradol and 30mg  of flexaril- states pain was worse with deep breath. No injuries or bruising. States did house work the week before and states anytime she does house work she becomes incapacitated. States has been taking Advil around the clock as needed. States overall has gotten better but states occasionally gets worse in certain positions.   Denies nausea, vomiting, clay colored stools.   Patient Active Problem List   Diagnosis Date Noted  . Hypertension 09/01/2017  . S/P cervical spinal fusion 12/01/2016  . MVP (mitral valve prolapse) 08/28/2015  . Dyslipidemia 08/28/2015  . Chronic back pain 10/20/2014  . Cervical radiculitis 10/20/2014  . Anorexia nervosa, restricting type 10/20/2014  . Fibromyalgia syndrome 10/20/2014  . Fibrocystic breast disease (FCBD) in female 10/20/2014  . Gastro-esophageal reflux disease without esophagitis 10/20/2014  . Irritable bowel syndrome (IBS) 10/20/2014  . Migraine without aura and responsive to treatment 10/20/2014  . Panic attack 10/20/2014  . Spondylosis 10/20/2014  . Tobacco use 10/20/2014  . Mixed urge and stress incontinence 10/20/2014  . Atrophy of vagina 10/20/2014    Past Medical History:  Diagnosis Date  . Atrophic vaginitis   . Backache   . Bipolar affective ([place])   . Bipolar disorder current episode depressed ([place]) 10/20/2014  . Fibromyalgia   . GERD (gastroesophageal reflux disease)   . Hypoglycemia   . Irritable bowel syndrome with diarrhea   . Migraine without aura with status migrainosus     Past  Surgical History:  Procedure Laterality Date  . ABDOMINAL HYSTERECTOMY    . APPENDECTOMY    . KNEE SURGERY Right 10/15/2015   [facility]-removed meniscus  . SPINAL FUSION     Neck 3/4/5  . TONSILLECTOMY    . TOOTH EXTRACTION  02/03/2016    Social History   Tobacco Use  . Smoking status: Current Some Day Smoker    Years: 39.00    Types: E-cigarettes, Cigarettes    Start date: 05/04/1977  . Smokeless tobacco: Never Used  . Tobacco comment: patient states she has quit several times but then she starts again  Substance Use Topics  . Alcohol use: Yes    Alcohol/week: 4.2 oz    Types: 7 Standard drinks or equivalent per week    Comment: glass of wine/or 2 oz of alcohol every evening     Current Outpatient Medications:  .  amLODipine (NORVASC) 10 MG tablet, Take 1 tablet (10 mg total) by mouth daily., Disp: 90 tablet, Rfl: 3 .  aspirin EC 81 MG tablet, Take 81 mg by mouth., Disp: , Rfl:  .  b complex vitamins capsule, Take by mouth., Disp: , Rfl:  .  calcium citrate-vitamin D (CITRACAL+D) 315-200 MG-UNIT tablet, Take by mouth., Disp: , Rfl:  .  clonazePAM (KLONOPIN) 0.5 MG tablet, Take 1 tablet (0.5 mg total) by mouth daily as needed for anxiety., Disp: 30 tablet, Rfl: 0 .  cyclobenzaprine (FLEXERIL) 10 MG tablet, Take 1 tablet (10 mg total) at bedtime by  mouth., Disp: 90 tablet, Rfl: 1 .  FIBER PO, 1 serving daily., Disp: , Rfl:  .  FLUoxetine (PROZAC) 40 MG capsule, Take 1 capsule (40 mg total) by mouth daily., Disp: 90 capsule, Rfl: 3 .  mirtazapine (REMERON SOL-TAB) 15 MG disintegrating tablet, Take 1 tablet (15 mg total) by mouth at bedtime. Up to two daily for anorexia, Disp: 60 tablet, Rfl: 2 .  Nutritional Supplements (JUICE PLUS FIBRE PO), Take 1 capsule by mouth daily., Disp: , Rfl:  .  olmesartan (BENICAR) 40 MG tablet, Take 1 tablet (40 mg total) daily by mouth., Disp: 90 tablet, Rfl: 1 .  Omeprazole 20 MG TBEC, TAKE 1 TABLET BY MOUTH EVERY DAY, Disp: 84 tablet, Rfl: 0 .   pregabalin (LYRICA) 50 MG capsule, Take 1 capsule (50 mg total) by mouth 3 (three) times daily., Disp: 90 capsule, Rfl: 2 .  Probiotic TBEC, Take by mouth., Disp: , Rfl:  .  QUEtiapine (SEROQUEL) 50 MG tablet, Take 1-3 tablets (50-150 mg total) by mouth at bedtime., Disp: 90 tablet, Rfl: 0 .  SUMAtriptan (IMITREX) 100 MG tablet, Take 1 tablet (100 mg total) every 2 (two) hours as needed by mouth for migraine. May repeat in 2 hours if headache persists or recurs., Disp: 10 tablet, Rfl: 0 .  traMADol (ULTRAM) 50 MG tablet, Take 1 tablet (50 mg total) by mouth every 12 (twelve) hours as needed., Disp: 60 tablet, Rfl: 2 .  Lido-Capsaicin-Men-Methyl Sal 0.5-0.035-5-20 % PTCH, Apply 1 patch topically daily., Disp: 5 patch, Rfl: 2 .  meloxicam (MOBIC) 7.5 MG tablet, Take 1 tablet (7.5 mg total) by mouth daily., Disp: 30 tablet, Rfl: 0  Allergies  Allergen Reactions  . Cephalosporins Anaphylaxis  . Cortisone     blackouts  . Cephalexin   . Penicillins   . Sulfa Antibiotics   . Sulfacetamide Sodium   . Fentanyl Nausea And Vomiting    blindness    ROS  No other specific complaints in a complete review of systems (except as listed in HPI above).  Objective  Vitals:   10/28/17 1151 10/28/17 1249  BP: (!) 160/80 124/82  Pulse: 100   Resp: 16   Temp: 98.4 F (36.9 C)   TempSrc: Oral   SpO2: 95%   Weight: 135 lb 8 oz (61.5 kg)   Height: 5\' 4"  (1.626 m)      Body mass index is 23.26 kg/m.  Nursing Note and Vital Signs reviewed.  Physical Exam  Skin:       Constitutional: Patient appears well-developed and well-nourished. Leaning towards right side.  Cardiovascular: Normal rate, regular rhythm, S1/S2 present.  No murmur or rub heard.  Pulmonary/Chest: Effort normal and breath sounds clear.  Left side-Chest wall tenderness, no bruising, deformity or redness noted Abdominal: Soft and non-tender, bowel sounds present  Psychiatric: Patient has a normal mood and affect. behavior is  normal. Judgment and thought content normal.  No results found for this or any previous visit (from the past 72 hour(s)).  Assessment & Plan  1. Acute left-sided thoracic back pain - Alternate with ice and heat 20 minutes a time  - Stop taking advil and aleve and take meloxicam instead, rx sent to pharmacy; can take additional tylenol as needed - Continue flexeril as needed for muscle spasms - Light back stretching after pain medication is onboard and after heat - REST - Drink lots of water - If unimproved can refer to ortho  - Lido-Capsaicin-Men-Methyl Sal 0.5-0.035-5-20 % PTCH; Apply  1 patch topically daily.  Dispense: 5 patch; Refill: 2 - meloxicam (MOBIC) 7.5 MG tablet; Take 1 tablet (7.5 mg total) by mouth daily.  Dispense: 30 tablet; Refill: 0   -Red flags and when to present for emergency care or RTC including chest pain, shortness of breath, new/worsening/un-resolving symptoms,  reviewed with patient at time of visit. Follow up and care instructions discussed and provided in AVS. -Reviewed Health Maintenance: mammogram number given   --------------------------------- I have reviewed this encounter including the documentation in this note and/or discussed this patient with the provider, Suezanne Cheshire DNP AGNP-C. I am certifying that I agree with the content of this note as supervising physician. Enid Derry, [facility] 10/28/2017, 5:21 PM

## 2017-10-28 NOTE — Patient Instructions (Addendum)
-   Alternate with ice and heat 20 minutes a time  - Stop taking advil and aleve and take meloxicam instead, rx sent to pharmacy; can take additional tylenol as needed - Use lidocaine patch  - Continue flexeril as needed for muscle spasms - Light back stretching after pain medication is onboard and after heat - REST - Drink lots of water - If unimproved can refer to ortho    Please do call to schedule your mammogram; the number to schedule one at either [facility] or [facility] is [phone]

## 2017-12-07 ENCOUNTER — Other Ambulatory Visit: Payer: Self-pay | Admitting: Family Medicine

## 2017-12-28 ENCOUNTER — Telehealth: Payer: Self-pay | Admitting: Family Medicine

## 2017-12-28 NOTE — Telephone Encounter (Signed)
Copied from [place] [id number]. Topic: Appointment Scheduling - Scheduling Inquiry for Clinic >> Dec 28, 2017  3:56 PM Mylinda Latina, NT wrote: Reason for CRM: Patient called and states that she was to schedule an appt with Dr. Ancil Boozer for increase panic attack and med refill appt. D. Sowles doesn't have anything until the end of Sept. Please call patient to see if she can be seen sooner CB# [phone]

## 2017-12-29 NOTE — Telephone Encounter (Signed)
Spoke with patient and put her on the schedule with Sowles for this Friday

## 2017-12-31 ENCOUNTER — Ambulatory Visit: Payer: BC Managed Care – PPO | Admitting: Family Medicine

## 2017-12-31 ENCOUNTER — Encounter: Payer: Self-pay | Admitting: Family Medicine

## 2017-12-31 VITALS — BP 134/92 | HR 110 | Temp 98.3°F | Resp 20 | Ht 64.0 in | Wt 132.8 lb

## 2017-12-31 DIAGNOSIS — I10 Essential (primary) hypertension: Secondary | ICD-10-CM | POA: Diagnosis not present

## 2017-12-31 DIAGNOSIS — F410 Panic disorder [episodic paroxysmal anxiety] without agoraphobia: Secondary | ICD-10-CM

## 2017-12-31 DIAGNOSIS — F3132 Bipolar disorder, current episode depressed, moderate: Secondary | ICD-10-CM

## 2017-12-31 DIAGNOSIS — Z23 Encounter for immunization: Secondary | ICD-10-CM | POA: Diagnosis not present

## 2017-12-31 MED ORDER — OLMESARTAN MEDOXOMIL 40 MG PO TABS: 40.0000 mg | ORAL_TABLET | Freq: Every day | ORAL | 1 refills | Status: DC

## 2017-12-31 MED ORDER — FLUOXETINE HCL 40 MG PO CAPS: 80.0000 mg | ORAL_CAPSULE | Freq: Every day | ORAL | 0 refills | Status: DC

## 2017-12-31 MED ORDER — CLONAZEPAM 0.5 MG PO TABS: 0.5000 mg | ORAL_TABLET | Freq: Every day | ORAL | 0 refills | Status: DC | PRN
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
## 2018-02-07 ENCOUNTER — Ambulatory Visit: Payer: BC Managed Care – PPO | Admitting: Family Medicine

## 2018-02-07 ENCOUNTER — Other Ambulatory Visit: Payer: Self-pay | Admitting: Family Medicine

## 2018-02-07 DIAGNOSIS — M5412 Radiculopathy, cervical region: Secondary | ICD-10-CM

## 2018-02-07 DIAGNOSIS — M797 Fibromyalgia: Secondary | ICD-10-CM

## 2018-02-07 DIAGNOSIS — M542 Cervicalgia: Secondary | ICD-10-CM

## 2018-02-07 DIAGNOSIS — F410 Panic disorder [episodic paroxysmal anxiety] without agoraphobia: Secondary | ICD-10-CM

## 2018-02-07 DIAGNOSIS — G8929 Other chronic pain: Secondary | ICD-10-CM

## 2018-02-10 ENCOUNTER — Encounter: Payer: Self-pay | Admitting: Family Medicine

## 2018-02-10 ENCOUNTER — Ambulatory Visit: Payer: BC Managed Care – PPO | Admitting: Family Medicine

## 2018-02-10 VITALS — BP 138/82 | HR 82 | Temp 98.4°F | Resp 16 | Ht 64.0 in | Wt 139.2 lb

## 2018-02-10 DIAGNOSIS — F3132 Bipolar disorder, current episode depressed, moderate: Secondary | ICD-10-CM

## 2018-02-10 DIAGNOSIS — Z23 Encounter for immunization: Secondary | ICD-10-CM | POA: Diagnosis not present

## 2018-02-10 DIAGNOSIS — Z79899 Other long term (current) drug therapy: Secondary | ICD-10-CM | POA: Diagnosis not present

## 2018-02-10 DIAGNOSIS — E785 Hyperlipidemia, unspecified: Secondary | ICD-10-CM

## 2018-02-10 DIAGNOSIS — I10 Essential (primary) hypertension: Secondary | ICD-10-CM | POA: Diagnosis not present

## 2018-02-10 DIAGNOSIS — Z131 Encounter for screening for diabetes mellitus: Secondary | ICD-10-CM

## 2018-02-10 MED ORDER — FLUOXETINE HCL 40 MG PO CAPS: 40.0000 mg | ORAL_CAPSULE | Freq: Every day | ORAL | 0 refills | Status: DC

## 2018-02-10 MED ORDER — CARIPRAZINE HCL 1.5 MG PO CAPS: 1.5000 mg | ORAL_CAPSULE | Freq: Every day | ORAL | 0 refills | Status: DC
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
## 2018-03-13 ENCOUNTER — Other Ambulatory Visit: Payer: Self-pay | Admitting: Family Medicine

## 2018-03-13 DIAGNOSIS — F3132 Bipolar disorder, current episode depressed, moderate: Secondary | ICD-10-CM

## 2018-03-21 ENCOUNTER — Encounter: Payer: Self-pay | Admitting: Family Medicine

## 2018-03-21 ENCOUNTER — Ambulatory Visit: Payer: BC Managed Care – PPO | Admitting: Family Medicine

## 2018-03-21 VITALS — BP 138/74 | HR 88 | Temp 97.4°F | Resp 16 | Ht 64.0 in | Wt 137.8 lb

## 2018-03-21 DIAGNOSIS — F410 Panic disorder [episodic paroxysmal anxiety] without agoraphobia: Secondary | ICD-10-CM

## 2018-03-21 DIAGNOSIS — M5431 Sciatica, right side: Secondary | ICD-10-CM | POA: Diagnosis not present

## 2018-03-21 DIAGNOSIS — F3132 Bipolar disorder, current episode depressed, moderate: Secondary | ICD-10-CM

## 2018-03-21 DIAGNOSIS — I10 Essential (primary) hypertension: Secondary | ICD-10-CM

## 2018-03-21 MED ORDER — CARIPRAZINE HCL 1.5 MG PO CAPS: 1.5000 mg | ORAL_CAPSULE | Freq: Every day | ORAL | 2 refills | Status: DC
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
## 2018-03-22 LAB — CBC WITH DIFFERENTIAL/PLATELET
Basophils Absolute: 78 cells/uL (ref 0–200)
Basophils Relative: 0.8 %
Eosinophils Absolute: 136 cells/uL (ref 15–500)
Eosinophils Relative: 1.4 %
HCT: 39.1 % (ref 35.0–45.0)
Hemoglobin: 13.1 g/dL (ref 11.7–15.5)
Lymphs Abs: 3211 cells/uL (ref 850–3900)
MCH: 29.6 pg (ref 27.0–33.0)
MCHC: 33.5 g/dL (ref 32.0–36.0)
MCV: 88.3 fL (ref 80.0–100.0)
MPV: 11.3 fL (ref 7.5–12.5)
Monocytes Relative: 7.2 %
Neutro Abs: 5578 cells/uL (ref 1500–7800)
Neutrophils Relative %: 57.5 %
Platelets: 262 10*3/uL (ref 140–400)
RBC: 4.43 10*6/uL (ref 3.80–5.10)
RDW: 13 % (ref 11.0–15.0)
Total Lymphocyte: 33.1 %
WBC mixed population: 698 cells/uL (ref 200–950)
WBC: 9.7 10*3/uL (ref 3.8–10.8)

## 2018-03-22 LAB — COMPLETE METABOLIC PANEL WITH GFR
AG Ratio: 1.8 (calc) (ref 1.0–2.5)
ALT: 9 U/L (ref 6–29)
AST: 19 U/L (ref 10–35)
Albumin: 4.4 g/dL (ref 3.6–5.1)
Alkaline phosphatase (APISO): 64 U/L (ref 33–130)
BUN: 18 mg/dL (ref 7–25)
CO2: 29 mmol/L (ref 20–32)
Calcium: 10 mg/dL (ref 8.6–10.4)
Chloride: 102 mmol/L (ref 98–110)
Creat: 0.76 mg/dL (ref 0.50–1.05)
GFR, Est African American: 103 mL/min/{1.73_m2} (ref >=60)
GFR, Est Non African American: 89 mL/min/{1.73_m2} (ref >=60)
Globulin: 2.4 g/dL (calc) (ref 1.9–3.7)
Glucose, Bld: 85 mg/dL (ref 65–139)
Potassium: 3.8 mmol/L (ref 3.5–5.3)
Sodium: 139 mmol/L (ref 135–146)
Total Bilirubin: 0.4 mg/dL (ref 0.2–1.2)
Total Protein: 6.8 g/dL (ref 6.1–8.1)

## 2018-03-22 LAB — HEMOGLOBIN A1C
Hgb A1c MFr Bld: 5.5 % of total Hgb (ref <5.7)
Mean Plasma Glucose: 111 (calc)
eAG (mmol/L): 6.2 (calc)

## 2018-03-22 LAB — LIPID PANEL
Cholesterol: 250 mg/dL — ABNORMAL HIGH (ref <200)
HDL: 58 mg/dL (ref >50)
LDL Cholesterol (Calc): 163 mg/dL (calc) — ABNORMAL HIGH
Non-HDL Cholesterol (Calc): 192 mg/dL (calc) — ABNORMAL HIGH (ref <130)
Total CHOL/HDL Ratio: 4.3 (calc) (ref <5.0)
Triglycerides: 150 mg/dL — ABNORMAL HIGH (ref <150)

## 2018-03-28 ENCOUNTER — Other Ambulatory Visit: Payer: Self-pay | Admitting: Family Medicine

## 2018-03-28 DIAGNOSIS — F3132 Bipolar disorder, current episode depressed, moderate: Secondary | ICD-10-CM

## 2018-04-15 ENCOUNTER — Other Ambulatory Visit: Payer: Self-pay | Admitting: Family Medicine

## 2018-04-15 DIAGNOSIS — M542 Cervicalgia: Principal | ICD-10-CM

## 2018-04-15 DIAGNOSIS — G8929 Other chronic pain: Secondary | ICD-10-CM

## 2018-06-21 ENCOUNTER — Encounter: Payer: Self-pay | Admitting: Family Medicine

## 2018-06-21 ENCOUNTER — Ambulatory Visit: Payer: BC Managed Care – PPO | Admitting: Family Medicine

## 2018-06-21 VITALS — BP 120/80 | HR 84 | Temp 98.4°F | Ht 64.0 in | Wt 139.3 lb

## 2018-06-21 DIAGNOSIS — F410 Panic disorder [episodic paroxysmal anxiety] without agoraphobia: Secondary | ICD-10-CM

## 2018-06-21 DIAGNOSIS — I10 Essential (primary) hypertension: Secondary | ICD-10-CM | POA: Diagnosis not present

## 2018-06-21 DIAGNOSIS — F3132 Bipolar disorder, current episode depressed, moderate: Secondary | ICD-10-CM | POA: Diagnosis not present

## 2018-06-21 DIAGNOSIS — M5412 Radiculopathy, cervical region: Secondary | ICD-10-CM

## 2018-06-21 DIAGNOSIS — M5431 Sciatica, right side: Secondary | ICD-10-CM

## 2018-06-21 DIAGNOSIS — M797 Fibromyalgia: Secondary | ICD-10-CM

## 2018-06-21 DIAGNOSIS — G8929 Other chronic pain: Secondary | ICD-10-CM

## 2018-06-21 DIAGNOSIS — S83209A Unspecified tear of unspecified meniscus, current injury, unspecified knee, initial encounter: Secondary | ICD-10-CM | POA: Insufficient documentation

## 2018-06-21 DIAGNOSIS — M542 Cervicalgia: Secondary | ICD-10-CM

## 2018-06-21 MED ORDER — TRAMADOL HCL 50 MG PO TABS: 50.0000 mg | ORAL_TABLET | Freq: Two times a day (BID) | ORAL | 0 refills | Status: DC | PRN

## 2018-06-21 MED ORDER — METAXALONE 800 MG PO TABS: 800.0000 mg | ORAL_TABLET | Freq: Three times a day (TID) | ORAL | 0 refills | Status: DC

## 2018-06-21 MED ORDER — OLMESARTAN MEDOXOMIL 40 MG PO TABS: 40.0000 mg | ORAL_TABLET | Freq: Every day | ORAL | 1 refills | Status: DC

## 2018-06-21 MED ORDER — PREDNISONE 10 MG (48) PO TBPK: ORAL_TABLET | ORAL | 0 refills | Status: DC

## 2018-06-21 MED ORDER — BUSPIRONE HCL 7.5 MG PO TABS: 7.5000 mg | ORAL_TABLET | Freq: Three times a day (TID) | ORAL | 0 refills | Status: DC

## 2018-06-21 MED ORDER — CARIPRAZINE HCL 1.5 MG PO CAPS: 1.5000 mg | ORAL_CAPSULE | Freq: Every day | ORAL | 2 refills | Status: DC
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
## 2018-07-05 ENCOUNTER — Telehealth: Payer: Self-pay | Admitting: Family Medicine

## 2018-07-05 NOTE — Telephone Encounter (Signed)
Copied from [place] [phone]. Topic: Quick Communication - See Telephone Encounter >> Jul 05, 2018  3:31 PM Nils Flack wrote: CRM for notification. See Telephone encounter for: 07/05/18. Insurance has denied test because pt has not had 6 weeks of physical therapy  Please advise.  Will fax copy of letter

## 2018-07-05 NOTE — Telephone Encounter (Signed)
She will need 6 weeks of PT

## 2018-07-06 NOTE — Telephone Encounter (Signed)
Patient called. She will suffer through. She is not willing to go through all of that.

## 2018-07-19 ENCOUNTER — Other Ambulatory Visit: Payer: Self-pay

## 2018-07-19 DIAGNOSIS — F3132 Bipolar disorder, current episode depressed, moderate: Secondary | ICD-10-CM

## 2018-07-19 MED ORDER — CARIPRAZINE HCL 1.5 MG PO CAPS: 1.5000 mg | ORAL_CAPSULE | Freq: Every day | ORAL | 0 refills | Status: DC

## 2018-07-19 NOTE — Telephone Encounter (Signed)
[facility] states patient insurance requires 90 day supply of Vraylar. Tee'd up 90 day prescription. Please sign and send in new prescription.

## 2018-09-19 ENCOUNTER — Ambulatory Visit: Payer: BC Managed Care – PPO | Admitting: Family Medicine

## 2018-09-19 ENCOUNTER — Encounter: Payer: Self-pay | Admitting: Family Medicine

## 2018-09-19 ENCOUNTER — Other Ambulatory Visit: Payer: Self-pay

## 2018-09-19 VITALS — BP 158/94 | HR 86 | Temp 98.1°F | Resp 16 | Ht 64.0 in | Wt 139.2 lb

## 2018-09-19 DIAGNOSIS — M797 Fibromyalgia: Secondary | ICD-10-CM

## 2018-09-19 DIAGNOSIS — F3132 Bipolar disorder, current episode depressed, moderate: Secondary | ICD-10-CM | POA: Diagnosis not present

## 2018-09-19 DIAGNOSIS — M5431 Sciatica, right side: Secondary | ICD-10-CM

## 2018-09-19 DIAGNOSIS — Z1239 Encounter for other screening for malignant neoplasm of breast: Secondary | ICD-10-CM

## 2018-09-19 DIAGNOSIS — M542 Cervicalgia: Secondary | ICD-10-CM

## 2018-09-19 DIAGNOSIS — M5412 Radiculopathy, cervical region: Secondary | ICD-10-CM | POA: Diagnosis not present

## 2018-09-19 DIAGNOSIS — R040 Epistaxis: Secondary | ICD-10-CM

## 2018-09-19 DIAGNOSIS — F410 Panic disorder [episodic paroxysmal anxiety] without agoraphobia: Secondary | ICD-10-CM

## 2018-09-19 DIAGNOSIS — G8929 Other chronic pain: Secondary | ICD-10-CM

## 2018-09-19 MED ORDER — CARIPRAZINE HCL 1.5 MG PO CAPS: 1.5000 mg | ORAL_CAPSULE | Freq: Every day | ORAL | 0 refills | Status: DC

## 2018-09-19 MED ORDER — TRAMADOL HCL 50 MG PO TABS: 50.0000 mg | ORAL_TABLET | Freq: Two times a day (BID) | ORAL | 0 refills | Status: DC | PRN

## 2018-09-19 MED ORDER — METAXALONE 800 MG PO TABS: 800.0000 mg | ORAL_TABLET | Freq: Three times a day (TID) | ORAL | 0 refills | Status: DC

## 2018-09-19 MED ORDER — HYDROXYZINE HCL 25 MG PO TABS: 25.0000 mg | ORAL_TABLET | Freq: Three times a day (TID) | ORAL | 0 refills | Status: DC | PRN
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
## 2018-10-11 ENCOUNTER — Other Ambulatory Visit: Payer: Self-pay

## 2018-10-11 ENCOUNTER — Ambulatory Visit (INDEPENDENT_AMBULATORY_CARE_PROVIDER_SITE_OTHER): Payer: BC Managed Care – PPO | Admitting: Licensed Clinical Social Worker

## 2018-10-11 ENCOUNTER — Encounter: Payer: Self-pay | Admitting: Licensed Clinical Social Worker

## 2018-10-11 DIAGNOSIS — F3130 Bipolar disorder, current episode depressed, mild or moderate severity, unspecified: Secondary | ICD-10-CM | POA: Diagnosis not present
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
## 2018-11-02 ENCOUNTER — Ambulatory Visit: Payer: BC Managed Care – PPO | Admitting: Licensed Clinical Social Worker

## 2018-11-02 ENCOUNTER — Other Ambulatory Visit: Payer: Self-pay

## 2018-11-20 ENCOUNTER — Other Ambulatory Visit: Payer: Self-pay | Admitting: Nurse Practitioner

## 2018-11-20 DIAGNOSIS — I10 Essential (primary) hypertension: Secondary | ICD-10-CM

## 2018-11-30 ENCOUNTER — Encounter: Payer: Self-pay | Admitting: Psychiatry

## 2018-11-30 ENCOUNTER — Other Ambulatory Visit: Payer: Self-pay

## 2018-11-30 ENCOUNTER — Ambulatory Visit (INDEPENDENT_AMBULATORY_CARE_PROVIDER_SITE_OTHER): Payer: BC Managed Care – PPO | Admitting: Psychiatry

## 2018-11-30 DIAGNOSIS — F4310 Post-traumatic stress disorder, unspecified: Secondary | ICD-10-CM | POA: Insufficient documentation

## 2018-11-30 DIAGNOSIS — F3162 Bipolar disorder, current episode mixed, moderate: Secondary | ICD-10-CM | POA: Diagnosis not present

## 2018-11-30 DIAGNOSIS — F410 Panic disorder [episodic paroxysmal anxiety] without agoraphobia: Secondary | ICD-10-CM | POA: Insufficient documentation

## 2018-11-30 DIAGNOSIS — G4701 Insomnia due to medical condition: Secondary | ICD-10-CM

## 2018-11-30 DIAGNOSIS — Z9189 Other specified personal risk factors, not elsewhere classified: Secondary | ICD-10-CM

## 2018-11-30 MED ORDER — CARIPRAZINE HCL 1.5 MG PO CAPS: 3.0000 mg | ORAL_CAPSULE | Freq: Every day | ORAL | 0 refills | Status: DC

## 2018-11-30 MED ORDER — CARBAMAZEPINE ER 100 MG PO CP12: 100.0000 mg | ORAL_CAPSULE | Freq: Two times a day (BID) | ORAL | 1 refills | Status: DC

## 2018-11-30 MED ORDER — CLONAZEPAM 0.5 MG PO TABS: 0.5000 mg | ORAL_TABLET | ORAL | 0 refills | Status: DC
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
## 2018-12-12 ENCOUNTER — Other Ambulatory Visit: Payer: Self-pay

## 2018-12-12 ENCOUNTER — Ambulatory Visit: Payer: BC Managed Care – PPO | Admitting: Licensed Clinical Social Worker

## 2018-12-14 ENCOUNTER — Ambulatory Visit (INDEPENDENT_AMBULATORY_CARE_PROVIDER_SITE_OTHER): Payer: BC Managed Care – PPO | Admitting: Psychiatry

## 2018-12-14 ENCOUNTER — Encounter: Payer: Self-pay | Admitting: Psychiatry

## 2018-12-14 ENCOUNTER — Telehealth (HOSPITAL_COMMUNITY): Payer: Self-pay | Admitting: Psychiatry

## 2018-12-14 ENCOUNTER — Other Ambulatory Visit: Payer: Self-pay

## 2018-12-14 DIAGNOSIS — F410 Panic disorder [episodic paroxysmal anxiety] without agoraphobia: Secondary | ICD-10-CM | POA: Diagnosis not present

## 2018-12-14 DIAGNOSIS — F3162 Bipolar disorder, current episode mixed, moderate: Secondary | ICD-10-CM

## 2018-12-14 DIAGNOSIS — F4310 Post-traumatic stress disorder, unspecified: Secondary | ICD-10-CM | POA: Diagnosis not present

## 2018-12-14 DIAGNOSIS — Z9189 Other specified personal risk factors, not elsewhere classified: Secondary | ICD-10-CM

## 2018-12-14 DIAGNOSIS — G4701 Insomnia due to medical condition: Secondary | ICD-10-CM | POA: Diagnosis not present

## 2018-12-14 MED ORDER — HYDROXYZINE HCL 50 MG PO TABS: 50.0000 mg | ORAL_TABLET | Freq: Two times a day (BID) | ORAL | 0 refills | Status: DC | PRN

## 2018-12-14 MED ORDER — PRAZOSIN HCL 1 MG PO CAPS: 2.0000 mg | ORAL_CAPSULE | Freq: Every day | ORAL | 1 refills | Status: DC

## 2018-12-14 MED ORDER — CARIPRAZINE HCL 1.5 MG PO CAPS: 1.5000 mg | ORAL_CAPSULE | Freq: Every day | ORAL | 0 refills | Status: DC

## 2018-12-14 MED ORDER — MIRTAZAPINE 15 MG PO TBDP: 15.0000 mg | ORAL_TABLET | Freq: Every day | ORAL | 0 refills | Status: DC

## 2018-12-14 NOTE — Telephone Encounter (Signed)
D:  Dr. Shea Evans referred pt to [facility].  A:  Placed call to orient pt and provide her with a start date; but there was no answer.  Will attempt at a later time.  Inform Dr. Shea Evans.
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
## 2018-12-15 ENCOUNTER — Telehealth (HOSPITAL_COMMUNITY): Payer: Self-pay | Admitting: Psychiatry

## 2018-12-15 ENCOUNTER — Telehealth: Payer: Self-pay

## 2018-12-15 LAB — TSH: TSH: 0.535 u[IU]/mL (ref 0.450–4.500)

## 2018-12-15 LAB — LIPID PANEL
Chol/HDL Ratio: 3.8 ratio (ref 0.0–4.4)
Cholesterol, Total: 208 mg/dL — ABNORMAL HIGH (ref 100–199)
HDL: 55 mg/dL (ref >39)
LDL Calculated: 134 mg/dL — ABNORMAL HIGH (ref 0–99)
Triglycerides: 96 mg/dL (ref 0–149)
VLDL Cholesterol Cal: 19 mg/dL (ref 5–40)

## 2018-12-15 LAB — COMPREHENSIVE METABOLIC PANEL
ALT: 11 IU/L (ref 0–32)
AST: 23 IU/L (ref 0–40)
Albumin/Globulin Ratio: 2 (ref 1.2–2.2)
Albumin: 4.7 g/dL (ref 3.8–4.9)
Alkaline Phosphatase: 66 IU/L (ref 39–117)
BUN/Creatinine Ratio: 23 (ref 9–23)
BUN: 20 mg/dL (ref 6–24)
Bilirubin Total: 0.2 mg/dL (ref 0.0–1.2)
CO2: 26 mmol/L (ref 20–29)
Calcium: 9.9 mg/dL (ref 8.7–10.2)
Chloride: 100 mmol/L (ref 96–106)
Creatinine, Ser: 0.86 mg/dL (ref 0.57–1.00)
GFR calc Af Amer: 88 mL/min/{1.73_m2} (ref >59)
GFR calc non Af Amer: 76 mL/min/{1.73_m2} (ref >59)
Globulin, Total: 2.3 g/dL (ref 1.5–4.5)
Glucose: 110 mg/dL — ABNORMAL HIGH (ref 65–99)
Potassium: 4.3 mmol/L (ref 3.5–5.2)
Sodium: 141 mmol/L (ref 134–144)
Total Protein: 7 g/dL (ref 6.0–8.5)

## 2018-12-15 LAB — PROLACTIN: Prolactin: 5.9 ng/mL (ref 4.8–23.3)

## 2018-12-15 NOTE — Telephone Encounter (Signed)
received fax that patient sleep study appointment was set up for aug 13 -2020

## 2018-12-15 NOTE — Telephone Encounter (Signed)
Thanks

## 2018-12-16 ENCOUNTER — Telehealth (HOSPITAL_COMMUNITY): Payer: Self-pay | Admitting: Psychiatry

## 2018-12-16 ENCOUNTER — Telehealth: Payer: Self-pay | Admitting: Psychiatry

## 2018-12-16 NOTE — Telephone Encounter (Signed)
Per Nancy Lucero - Phone number to reach patient since her work phone does not accept private numbers- [phone].  She declined IOP

## 2018-12-16 NOTE — Telephone Encounter (Signed)
D:  Dr. Shea Evans had referred pt to [facility].  A:  Pt is declining [facility] at this time.  "Group will not work for me at this time d/t the hours and I can't afford to be pulled out of work."  Inform Dr. Shea Evans.

## 2018-12-21 ENCOUNTER — Other Ambulatory Visit: Payer: Self-pay

## 2018-12-21 ENCOUNTER — Encounter: Payer: Self-pay | Admitting: Family Medicine

## 2018-12-21 ENCOUNTER — Ambulatory Visit: Payer: BC Managed Care – PPO | Admitting: Family Medicine

## 2018-12-21 VITALS — BP 108/68 | HR 103 | Temp 97.7°F | Resp 16 | Ht 64.0 in | Wt 133.2 lb

## 2018-12-21 DIAGNOSIS — E785 Hyperlipidemia, unspecified: Secondary | ICD-10-CM | POA: Diagnosis not present

## 2018-12-21 DIAGNOSIS — M5412 Radiculopathy, cervical region: Secondary | ICD-10-CM

## 2018-12-21 DIAGNOSIS — M542 Cervicalgia: Secondary | ICD-10-CM

## 2018-12-21 DIAGNOSIS — M797 Fibromyalgia: Secondary | ICD-10-CM | POA: Diagnosis not present

## 2018-12-21 DIAGNOSIS — I10 Essential (primary) hypertension: Secondary | ICD-10-CM

## 2018-12-21 DIAGNOSIS — M5431 Sciatica, right side: Secondary | ICD-10-CM

## 2018-12-21 DIAGNOSIS — G8929 Other chronic pain: Secondary | ICD-10-CM

## 2018-12-21 DIAGNOSIS — F3162 Bipolar disorder, current episode mixed, moderate: Secondary | ICD-10-CM | POA: Diagnosis not present

## 2018-12-21 DIAGNOSIS — Z79899 Other long term (current) drug therapy: Secondary | ICD-10-CM

## 2018-12-21 MED ORDER — TRAMADOL HCL 50 MG PO TABS: 50.0000 mg | ORAL_TABLET | Freq: Two times a day (BID) | ORAL | 0 refills | Status: DC | PRN

## 2018-12-21 MED ORDER — METAXALONE 800 MG PO TABS: 800.0000 mg | ORAL_TABLET | Freq: Three times a day (TID) | ORAL | 1 refills | Status: DC
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
## 2019-01-02 ENCOUNTER — Encounter: Payer: Self-pay | Admitting: Licensed Clinical Social Worker

## 2019-01-02 ENCOUNTER — Ambulatory Visit (INDEPENDENT_AMBULATORY_CARE_PROVIDER_SITE_OTHER): Payer: BC Managed Care – PPO | Admitting: Licensed Clinical Social Worker

## 2019-01-02 ENCOUNTER — Other Ambulatory Visit: Payer: Self-pay

## 2019-01-02 DIAGNOSIS — F3162 Bipolar disorder, current episode mixed, moderate: Secondary | ICD-10-CM | POA: Diagnosis not present

## 2019-01-02 DIAGNOSIS — F4310 Post-traumatic stress disorder, unspecified: Secondary | ICD-10-CM

## 2019-01-02 DIAGNOSIS — F410 Panic disorder [episodic paroxysmal anxiety] without agoraphobia: Secondary | ICD-10-CM | POA: Diagnosis not present

## 2019-01-02 NOTE — Progress Notes (Signed)
Virtual Visit via Video Note  I connected with Nancy Lucero on 01/02/19 at 11:00 AM EDT by a video enabled telemedicine application and verified that I am speaking with the correct person using two identifiers.   I discussed the limitations of evaluation and management by telemedicine and the availability of in person appointments. The patient expressed understanding and agreed to proceed.    I discussed the assessment and treatment plan with the patient. The patient was provided an opportunity to ask questions and all were answered. The patient agreed with the plan and demonstrated an understanding of the instructions.   The patient was advised to call back or seek an in-person evaluation if the symptoms worsen or if the condition fails to improve as anticipated.  I provided 30 minutes of non-face-to-face time during this encounter.   Alden Hipp, Nancy Lucero    THERAPIST PROGRESS NOTE  Session Time: 1100  Participation Level: Minimal  Behavioral Response: NeatAlertAnxious  Type of Therapy: Individual Therapy  Treatment Goals addressed: Anxiety  Interventions: CBT  Summary: Nancy Lucero is a 53 y.o. female who presents with continued symptoms related to her diagnosis. Nancy Lucero reports doing well since our last session. She reports, "I changed my medications on my own, I haven't told the doctor yet, but the Arman Filter was making me crazy." Nancy Lucero reports she has since returned to taking her Prozac and she is feeling much better. Nancy Lucero validated Nancy Lucero's feelings around medication, but encouraged her to speak with MD regarding these changes and to ensure she has enough refills of her medications. Nancy Lucero expressed understanding and agreement with this idea. Further, Nancy Lucero reports increased anxiety at times since the last time we spoke. 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 Additionally, Nancy Lucero shared several resources on improving CBT skills and how they could be utilized to improve anxiety symptoms. We walked through several examples of how CBT can be used to improve anxiety. Nancy Lucero expressed understanding and agreement with this information.   Suicidal/Homicidal: No  Therapist Response: Nancy Lucero continues to work towards her tx goals but has not yet reached them. We will continue to work on improving CBT skills moving forward.   Plan: Return again in 4 weeks.  Diagnosis: Axis I: Post Traumatic Stress Disorder    Axis II: No diagnosis    Alden Hipp, Nancy Lucero 01/02/2019

## 2019-01-17 ENCOUNTER — Telehealth: Payer: Self-pay | Admitting: Psychiatry

## 2019-01-17 DIAGNOSIS — Z79899 Other long term (current) drug therapy: Secondary | ICD-10-CM

## 2019-01-17 NOTE — Telephone Encounter (Signed)
Will sent diagnostic clarification to [facility] for labs ordered.

## 2019-01-19 ENCOUNTER — Ambulatory Visit (INDEPENDENT_AMBULATORY_CARE_PROVIDER_SITE_OTHER): Payer: BC Managed Care – PPO | Admitting: Psychiatry

## 2019-01-19 ENCOUNTER — Other Ambulatory Visit: Payer: Self-pay

## 2019-01-19 ENCOUNTER — Telehealth (HOSPITAL_COMMUNITY): Payer: Self-pay | Admitting: Psychiatry

## 2019-01-19 ENCOUNTER — Encounter: Payer: Self-pay | Admitting: Psychiatry

## 2019-01-19 DIAGNOSIS — G4701 Insomnia due to medical condition: Secondary | ICD-10-CM

## 2019-01-19 DIAGNOSIS — Z79899 Other long term (current) drug therapy: Secondary | ICD-10-CM

## 2019-01-19 DIAGNOSIS — F4310 Post-traumatic stress disorder, unspecified: Secondary | ICD-10-CM

## 2019-01-19 DIAGNOSIS — F410 Panic disorder [episodic paroxysmal anxiety] without agoraphobia: Secondary | ICD-10-CM | POA: Diagnosis not present

## 2019-01-19 DIAGNOSIS — F3162 Bipolar disorder, current episode mixed, moderate: Secondary | ICD-10-CM

## 2019-01-19 MED ORDER — FLUOXETINE HCL 40 MG PO CAPS: 40.0000 mg | ORAL_CAPSULE | Freq: Every day | ORAL | 1 refills | Status: DC

## 2019-01-19 MED ORDER — CLONAZEPAM 0.5 MG PO TABS: 0.2500 mg | ORAL_TABLET | ORAL | 0 refills | Status: DC

## 2019-01-19 NOTE — Telephone Encounter (Signed)
D:  Pt was referred per Dr. Shea Evans to attend MH-IOP.  Since pt's cell number wasn't working; Probation officer called pt's home number to re-orient her.  Pt answered and was very agitated.  C/O no one caring.  "I suppose to get bloodwork done, but no one is calling me.  Do I need to do everyone's ---- job?"  Pt started using a lot of profanity.  Reports not being satisfied with Dr. Shea Evans.  According to pt, she has been on Klonopin for 20 yrs and is only getting so many a week.  Pt states she has called another psychiatrist office (Dr. Toy Care) and has an upcoming new pt appt on 03-14-19.  When asked if pt plans to attend MH-IOP; pt states she is thinking about going to the ED to be admitted as opposed to attending group.  "I don't have time to do group.  I need to work and pay my mortgage."  A:  Provided pt with support and feedback.  Pt is med seeking (Klonopin).  Encouraged pt to call case manager if she decides to do group. Inform Dr. Shea Evans of conversation with pt.  R:  Pt receptive.
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
## 2019-01-19 NOTE — Telephone Encounter (Signed)
D:  Dr. Shea Evans referred pt back to [facility].  A:  Placed call to re-orient pt and provide her with a start date, but there was no answer.  Will attempt later.  Inform Dr. Shea Evans.

## 2019-01-23 ENCOUNTER — Telehealth: Payer: Self-pay

## 2019-01-23 DIAGNOSIS — F410 Panic disorder [episodic paroxysmal anxiety] without agoraphobia: Secondary | ICD-10-CM

## 2019-01-23 MED ORDER — QUETIAPINE FUMARATE 50 MG PO TABS: 50.0000 mg | ORAL_TABLET | Freq: Every day | ORAL | 1 refills | Status: DC | PRN

## 2019-01-23 NOTE — Telephone Encounter (Signed)
pt called left message that she needed a refill on seroquel she states that she had some anxiety and that she took the lseroquel she had left over and it helped her. she would like to take botk seroquel and prozac.

## 2019-01-23 NOTE — Telephone Encounter (Signed)
Return call to patient.  She reports she wants to be back on Seroquel which Dr. Ancil Boozer had given to her previously.  She reports she feels it is helpful since she took Seroquel recently from a leftover bottle that she had.  Looking back into her record she was on Seroquel 50 to 150 mg at bedtime.  Discussed with patient the risk of being on medications like Seroquel including her seizure-like spells which can be worsened.  We will put her back on Seroquel 50 to 150 mg as needed for panic symptoms.  Patient is transitioning to [facility] in [place].  Discussed with patient to request records so we can send records to [facility].

## 2019-02-01 ENCOUNTER — Encounter: Payer: Self-pay | Admitting: Licensed Clinical Social Worker

## 2019-02-01 ENCOUNTER — Other Ambulatory Visit: Payer: Self-pay

## 2019-02-01 ENCOUNTER — Ambulatory Visit (INDEPENDENT_AMBULATORY_CARE_PROVIDER_SITE_OTHER): Payer: BC Managed Care – PPO | Admitting: Licensed Clinical Social Worker

## 2019-02-01 DIAGNOSIS — F3162 Bipolar disorder, current episode mixed, moderate: Secondary | ICD-10-CM

## 2019-02-01 NOTE — Progress Notes (Signed)
Virtual Visit via Video Note  I connected with Nancy Lucero on 02/01/19 at  3:30 PM EDT by a video enabled telemedicine application and verified that I am speaking with the correct person using two identifiers.   I discussed the limitations of evaluation and management by telemedicine and the availability of in person appointments. The patient expressed understanding and agreed to proceed.  I discussed the assessment and treatment plan with the patient. The patient was provided an opportunity to ask questions and all were answered. The patient agreed with the plan and demonstrated an understanding of the instructions.   The patient was advised to call back or seek an in-person evaluation if the symptoms worsen or if the condition fails to improve as anticipated.  I provided 30 minutes of non-face-to-face time during this encounter.   Alden Hipp, Nancy Lucero    THERAPIST PROGRESS NOTE  Session Time: [id number]  Participation Level: Active  Behavioral Response: NeatAlertAnxious  Type of Therapy: Individual Therapy  Treatment Goals addressed: Coping  Interventions: CBT  Summary: Nancy Lucero is a 53 y.o. female who presents with continued symptoms related to her diagnosis. Nancy Lucero reports doing well since our last session, but noted her mood has been unstable. She reports feeling less panic and more paranoia over the last month. She reports feeling others are judging her, are aware of her mental health diagnosis, and know she feels uncertain of herself. Nancy Lucero encouraged Nancy Lucero to utilize CBT skills to talk herself through those moments. Nancy Lucero walked Nancy Lucero through several examples of how she could best utilize CBT skills in these moments to stay safe and calm in the moment. Nancy Lucero expressed understanding and agreement. Nancy Lucero reports being adherent with all medications currently, and has recently seen a new psychiatrist outside the clinic. "I don't know what's going on recently, but I  just want to go back to being myself." Nancy Lucero encouraged Nancy Lucero to continue utilizing skills we've developed during treatment. "I guess that helps, I just feel like a bundle of nerves all the time." Nancy Lucero asked Nancy Lucero if she was able to identify anything around the time she started to feel an increase in symptoms. "Well, my son was having some financial issues, and every time something happens with him I get all torn up. I haven't bounced back from that yet." Nancy Lucero validated those feelings and encouraged [name] to continue utilizing CBT skills in those moments as well.   Suicidal/Homicidal: No  Therapist Response: [name] continues to work towards her tx goals but has not yet reached them. We will continue to work on emotional regulation skills and improving communication moving forward.   Plan: Return again in 4 weeks.  Diagnosis: Axis I: Bipolar 1 Disorder    Axis II: No diagnosis    Alden Hipp, Nancy Lucero 02/01/2019

## 2019-02-06 ENCOUNTER — Encounter: Payer: Self-pay | Admitting: Psychiatry

## 2019-02-06 ENCOUNTER — Other Ambulatory Visit: Payer: Self-pay

## 2019-02-06 ENCOUNTER — Ambulatory Visit (INDEPENDENT_AMBULATORY_CARE_PROVIDER_SITE_OTHER): Payer: BC Managed Care – PPO | Admitting: Psychiatry

## 2019-02-06 DIAGNOSIS — F3162 Bipolar disorder, current episode mixed, moderate: Secondary | ICD-10-CM | POA: Diagnosis not present

## 2019-02-06 DIAGNOSIS — F4310 Post-traumatic stress disorder, unspecified: Secondary | ICD-10-CM

## 2019-02-06 DIAGNOSIS — F410 Panic disorder [episodic paroxysmal anxiety] without agoraphobia: Secondary | ICD-10-CM | POA: Diagnosis not present

## 2019-02-06 DIAGNOSIS — G4701 Insomnia due to medical condition: Secondary | ICD-10-CM | POA: Diagnosis not present
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
## 2019-03-05 ENCOUNTER — Other Ambulatory Visit: Payer: Self-pay | Admitting: Psychiatry

## 2019-03-05 DIAGNOSIS — F3162 Bipolar disorder, current episode mixed, moderate: Secondary | ICD-10-CM

## 2019-03-12 ENCOUNTER — Other Ambulatory Visit: Payer: Self-pay | Admitting: Family Medicine

## 2019-03-12 DIAGNOSIS — I10 Essential (primary) hypertension: Secondary | ICD-10-CM

## 2019-03-24 ENCOUNTER — Ambulatory Visit (INDEPENDENT_AMBULATORY_CARE_PROVIDER_SITE_OTHER): Payer: BC Managed Care – PPO | Admitting: Family Medicine

## 2019-03-24 ENCOUNTER — Encounter: Payer: Self-pay | Admitting: Family Medicine

## 2019-03-24 ENCOUNTER — Other Ambulatory Visit: Payer: Self-pay

## 2019-03-24 DIAGNOSIS — M5431 Sciatica, right side: Secondary | ICD-10-CM | POA: Diagnosis not present

## 2019-03-24 DIAGNOSIS — G8929 Other chronic pain: Secondary | ICD-10-CM

## 2019-03-24 DIAGNOSIS — F3162 Bipolar disorder, current episode mixed, moderate: Secondary | ICD-10-CM

## 2019-03-24 DIAGNOSIS — M797 Fibromyalgia: Secondary | ICD-10-CM

## 2019-03-24 DIAGNOSIS — M5412 Radiculopathy, cervical region: Secondary | ICD-10-CM

## 2019-03-24 DIAGNOSIS — M542 Cervicalgia: Secondary | ICD-10-CM

## 2019-03-24 DIAGNOSIS — F410 Panic disorder [episodic paroxysmal anxiety] without agoraphobia: Secondary | ICD-10-CM

## 2019-03-24 DIAGNOSIS — I10 Essential (primary) hypertension: Secondary | ICD-10-CM

## 2019-03-24 DIAGNOSIS — F4310 Post-traumatic stress disorder, unspecified: Secondary | ICD-10-CM

## 2019-03-24 MED ORDER — AMLODIPINE BESYLATE 10 MG PO TABS: 10.0000 mg | ORAL_TABLET | Freq: Every day | ORAL | 1 refills | Status: DC

## 2019-03-24 MED ORDER — TRAMADOL HCL 50 MG PO TABS: 50.0000 mg | ORAL_TABLET | Freq: Two times a day (BID) | ORAL | 0 refills | Status: AC | PRN
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
## 2019-07-24 ENCOUNTER — Other Ambulatory Visit: Payer: Self-pay | Admitting: Psychiatry

## 2019-07-24 DIAGNOSIS — F4310 Post-traumatic stress disorder, unspecified: Secondary | ICD-10-CM

## 2019-07-24 DIAGNOSIS — F410 Panic disorder [episodic paroxysmal anxiety] without agoraphobia: Secondary | ICD-10-CM

## 2019-07-24 DIAGNOSIS — F3162 Bipolar disorder, current episode mixed, moderate: Secondary | ICD-10-CM

## 2019-08-15 ENCOUNTER — Other Ambulatory Visit: Payer: Self-pay | Admitting: Family Medicine

## 2019-08-15 DIAGNOSIS — M5431 Sciatica, right side: Secondary | ICD-10-CM

## 2019-09-04 ENCOUNTER — Other Ambulatory Visit: Payer: Self-pay

## 2019-09-04 ENCOUNTER — Encounter: Payer: Self-pay | Admitting: Family Medicine

## 2019-09-04 ENCOUNTER — Ambulatory Visit: Payer: BC Managed Care – PPO | Admitting: Family Medicine

## 2019-09-04 VITALS — BP 130/84 | HR 109 | Temp 97.1°F | Resp 16 | Ht 64.0 in | Wt 147.1 lb

## 2019-09-04 DIAGNOSIS — I10 Essential (primary) hypertension: Secondary | ICD-10-CM | POA: Diagnosis not present

## 2019-09-04 DIAGNOSIS — M797 Fibromyalgia: Secondary | ICD-10-CM

## 2019-09-04 DIAGNOSIS — E785 Hyperlipidemia, unspecified: Secondary | ICD-10-CM

## 2019-09-04 DIAGNOSIS — M545 Low back pain, unspecified: Secondary | ICD-10-CM

## 2019-09-04 DIAGNOSIS — F3162 Bipolar disorder, current episode mixed, moderate: Secondary | ICD-10-CM

## 2019-09-04 DIAGNOSIS — M542 Cervicalgia: Secondary | ICD-10-CM | POA: Diagnosis not present

## 2019-09-04 DIAGNOSIS — G8929 Other chronic pain: Secondary | ICD-10-CM

## 2019-09-04 MED ORDER — AMLODIPINE BESYLATE 10 MG PO TABS: 10.0000 mg | ORAL_TABLET | Freq: Every day | ORAL | 1 refills | Status: DC

## 2019-09-04 MED ORDER — TRAMADOL HCL 50 MG PO TABS: 50.0000 mg | ORAL_TABLET | Freq: Three times a day (TID) | ORAL | 2 refills | Status: AC | PRN

## 2019-09-04 MED ORDER — OLMESARTAN MEDOXOMIL 40 MG PO TABS: 40.0000 mg | ORAL_TABLET | Freq: Every day | ORAL | 1 refills | Status: DC

## 2019-09-04 NOTE — Progress Notes (Signed)
Name: Nancy Lucero   MRN: [id number]    DOB: May 07, 1965   Date:09/04/2019       Progress Note  Subjective  Chief Complaint  Chief Complaint  Patient presents with  . Medication Refill    Refill Tramadol  . Fibromyalgia  . Back Pain    HPI  Bipolar disorder:She saw   Dr. Shea Evans and went a couple of times, but is now seeing Dr. Toy Care and getting Clonazepam, Zyprexa 7.5 mg and Prozac. She has episodes of vivid dreams and feels anxious all the time, but states medications seems to be working for her . She states mood is okay , still up and down . She states Zyprexa has helped level her mood out    HTN:bp has been controlled at home, no chest pain or palpitation. BP today is at goal.   History of c-spine fusion: she had multiple surgeries in 2004, continues to have daily  pain on her neck, seen by Dr. Cari Caraway, she has tingling and numbness on left hand and also left feet. She is having more incontinence but only when bladder is full and does not make it in time to the bathroom, and more episodes of numbness on arms and legs when laying down - stable and intermittent   Low back pain: today the pain is 10/10, no sciatic today she has flares like this intermittent, this episode has been going on for two weeks. She saw neurosurgeon and advised to have neck surgery at the time but she refused.   FMS: she satesbody aches today is  7/10 she denies mental fogginess, she has daily muscle aches. She is out of Tramadol and needs a refill  Dyslipidemia: based on cardiovascular risk below, risk of heart attack and strokes is still low  The 10-year ASCVD risk score Mikey Bussing DC Brooke Bonito., et al., 2013) is: 7.4%   Values used to calculate the score:     Age: 54 years     Sex: Female     Is Non-Hispanic African American: No     Diabetic: No     Tobacco smoker: Yes     Systolic Blood Pressure: [id number] mmHg     Is BP treated: Yes     HDL Cholesterol: 55 mg/dL     Total Cholesterol: 208 mg/dL   Patient  Active Problem List   Diagnosis Date Noted  . Bipolar 1 disorder, mixed, moderate ([place]) 11/30/2018  . Panic disorder 11/30/2018  . PTSD (post-traumatic stress disorder) 11/30/2018  . Insomnia due to medical condition 11/30/2018  . Torn meniscus 06/21/2018  . Hypertension 09/01/2017  . S/P cervical spinal fusion 12/01/2016  . MVP (mitral valve prolapse) 08/28/2015  . Dyslipidemia 08/28/2015  . Chronic back pain 10/20/2014  . Cervical radiculitis 10/20/2014  . Anorexia nervosa, restricting type 10/20/2014  . Fibromyalgia syndrome 10/20/2014  . Fibrocystic breast disease (FCBD) in female 10/20/2014  . Gastro-esophageal reflux disease without esophagitis 10/20/2014  . Irritable bowel syndrome (IBS) 10/20/2014  . Migraine without aura and responsive to treatment 10/20/2014  . Panic attacks 10/20/2014  . Spondylosis 10/20/2014  . Tobacco use 10/20/2014  . Mixed urge and stress incontinence 10/20/2014  . Atrophy of vagina 10/20/2014    Past Surgical History:  Procedure Laterality Date  . ABDOMINAL HYSTERECTOMY    . APPENDECTOMY    . KNEE SURGERY Right 10/15/2015   [facility]-removed meniscus  . SPINAL FUSION     Neck 3/4/5  . TONSILLECTOMY    . TOOTH EXTRACTION  02/03/2016    Family History  Problem Relation Age of Onset  . Mental illness Mother   . Heart disease Mother   . Depression Son   . Mental illness Son   . Mental illness Maternal Aunt   . Depression Son   . Mental illness Son   . Depression Son   . Mental illness Son   . Congestive Heart Failure Father   . Atrial fibrillation Father   . Heart disease Father   . Mental illness Sister     Social History   Tobacco Use  . Smoking status: Former Smoker    Years: 40.00    Types: E-cigarettes, Cigarettes    Start date: 05/04/1977  . Smokeless tobacco: Never Used  . Tobacco comment: patient states she has quit several times but then she starts again  Substance Use Topics  . Alcohol use: Yes    Alcohol/week: 7.0  standard drinks    Types: 7 Standard drinks or equivalent per week    Comment: glass of wine/or 2 oz of alcohol every evening     Current Outpatient Medications:  .  amLODipine (NORVASC) 10 MG tablet, Take 1 tablet (10 mg total) by mouth daily., Disp: 90 tablet, Rfl: 1 .  aspirin EC 81 MG tablet, Take 81 mg by mouth., Disp: , Rfl:  .  b complex vitamins capsule, Take by mouth., Disp: , Rfl:  .  clonazePAM (KLONOPIN) 1 MG tablet, Take 1 mg by mouth 4 (four) times daily as needed., Disp: , Rfl:  .  FIBER PO, 1 serving daily., Disp: , Rfl:  .  FLUoxetine (PROZAC) 40 MG capsule, Take 1 capsule (40 mg total) by mouth daily with breakfast., Disp: 30 capsule, Rfl: 1 .  ibuprofen (ADVIL,MOTRIN) 800 MG tablet, Take by mouth., Disp: , Rfl:  .  metaxalone (SKELAXIN) 800 MG tablet, Take 1 tablet (800 mg total) by mouth 3 (three) times daily., Disp: 180 tablet, Rfl: 1 .  Nutritional Supplements (JUICE PLUS FIBRE PO), Take 1 capsule by mouth daily., Disp: , Rfl:  .  OLANZapine (ZYPREXA) 7.5 MG tablet, Take 7.5 mg by mouth at bedtime., Disp: , Rfl:  .  olmesartan (BENICAR) 40 MG tablet, Take 1 tablet (40 mg total) by mouth daily., Disp: 90 tablet, Rfl: 1 .  Omeprazole 20 MG TBEC, TAKE 1 TABLET BY MOUTH EVERY DAY, Disp: 84 tablet, Rfl: 0 .  Probiotic TBEC, Take by mouth., Disp: , Rfl:  .  SUMAtriptan (IMITREX) 100 MG tablet, Take 1 tablet (100 mg total) every 2 (two) hours as needed by mouth for migraine. May repeat in 2 hours if headache persists or recurs., Disp: 10 tablet, Rfl: 0 .  calcium citrate-vitamin D (CITRACAL+D) 315-200 MG-UNIT tablet, Take by mouth., Disp: , Rfl:  .  traMADol (ULTRAM) 50 MG tablet, Take 1 tablet (50 mg total) by mouth every 8 (eight) hours as needed for up to 5 days., Disp: 60 tablet, Rfl: 2  Allergies  Allergen Reactions  . Cephalosporins Anaphylaxis  . Cortisone     blackouts  . Cephalexin   . Lyrica [Pregabalin]     Stutters with medication   . Penicillins   . Sulfa  Antibiotics   . Sulfacetamide Sodium   . Fentanyl Nausea And Vomiting    blindness    I personally reviewed active problem list, medication list, allergies, family history, social history, health maintenance with the patient/caregiver today.   ROS  Constitutional: Negative for fever or weight change.  Respiratory:  Negative for cough and shortness of breath.   Cardiovascular: Negative for chest pain or palpitations.  Gastrointestinal: positive  for intermittent abdominal pain, no bowel changes.  Musculoskeletal: Negative for gait problem or joint swelling.  Skin: Negative for rash.  Neurological: Negative for dizziness , positive for intermittent  headache.  No other specific complaints in a complete review of systems (except as listed in HPI above).  Objective  Vitals:   09/04/19 1404  BP: 130/84  Pulse: (!) 109  Resp: 16  Temp: (!) 97.1 F (36.2 C)  TempSrc: Temporal  SpO2: 95%  Weight: 147 lb 1.6 oz (66.7 kg)  Height: 5\' 4"  (1.626 m)    Body mass index is 25.25 kg/m.  Physical Exam  Constitutional: Patient appears well-developed and well-nourished.  No distress.  HEENT: head atraumatic, normocephalic, pupils equal and reactive to light Cardiovascular: Normal rate, regular rhythm and normal heart sounds.  No murmur heard. No BLE edema. Pulmonary/Chest: Effort normal and breath sounds normal. No respiratory distress. Abdominal: Soft.  There is no tenderness. Muscular skeletal: pain during palpation of neck, also pain ( very jumpy ) when palpating right lower back, negative straight leg raise Psychiatric: Patient has a normal mood and affect. behavior is normal. Judgment and thought content normal.  PHQ2/9: Depression screen [facility] 2/9 09/04/2019 03/24/2019 12/21/2018 09/19/2018 06/21/2018  Decreased Interest 1 3 3 3 1   Down, Depressed, Hopeless 1 3 3 3 1   PHQ - 2 Score 2 6 6 6 2   Altered sleeping 3 0 3 3 2   Tired, decreased energy 2 3 3 3 2   Change in appetite 2 3 3 3 1    Feeling bad or failure about yourself  2 3 3 3  -  Trouble concentrating 1 3 3 3  0  Moving slowly or fidgety/restless 1 0 3 3 0  Suicidal thoughts 0 0 0 0 0  PHQ-9 Score 13 18 24 24 7   Difficult doing work/chores - Very difficult Extremely dIfficult Extremely dIfficult Somewhat difficult  Some recent data might be hidden    phq 9 is positive   Fall Risk: Fall Risk  09/04/2019 03/24/2019 12/21/2018 09/19/2018 02/10/2018  Falls in the past year? 0 0 0 1 No  Number falls in past yr: 0 0 0 1 -  Comment - - - - -  Injury with Fall? 0 0 0 0 -  Comment - - - - -  Risk Factor Category  - - - - -  Risk for fall due to : - - - - -  Follow up - - - - -      Assessment & Plan  1. Hypertension, benign  - olmesartan (BENICAR) 40 MG tablet; Take 1 tablet (40 mg total) by mouth daily.  Dispense: 90 tablet; Refill: 1 - amLODipine (NORVASC) 10 MG tablet; Take 1 tablet (10 mg total) by mouth daily.  Dispense: 90 tablet; Refill: 1  2. Chronic neck pain  - traMADol (ULTRAM) 50 MG tablet; Take 1 tablet (50 mg total) by mouth every 8 (eight) hours as needed for up to 5 days.  Dispense: 60 tablet; Refill: 2  3. Acute right-sided low back pain without sciatica  She will try massage and tramadol   4. Bipolar 1 disorder, mixed, moderate ([facility])  Keep follow up with Dr. Toy Care   5. Fibromyalgia syndrome  Taking muscle relaxer prn   6. Dyslipidemia  On life style modification

## 2019-09-10 ENCOUNTER — Other Ambulatory Visit: Payer: Self-pay | Admitting: Family Medicine

## 2019-09-10 DIAGNOSIS — I10 Essential (primary) hypertension: Secondary | ICD-10-CM

## 2020-01-23 ENCOUNTER — Telehealth: Payer: Self-pay | Admitting: Family Medicine

## 2020-01-23 ENCOUNTER — Other Ambulatory Visit: Payer: Self-pay

## 2020-01-23 DIAGNOSIS — I10 Essential (primary) hypertension: Secondary | ICD-10-CM

## 2020-01-23 MED ORDER — OLMESARTAN MEDOXOMIL 40 MG PO TABS: 40.0000 mg | ORAL_TABLET | Freq: Every day | ORAL | 0 refills | Status: DC

## 2020-01-23 NOTE — Telephone Encounter (Signed)
PT need a refill  olmesartan (BENICAR) 40 MG tablet [[id number]]  traMADol (ULTRAM) 50 MG tablet [[id number]]  [id number] [address], [address] AT [address]  [place] [address]  Phone: [phone] Fax: [phone]

## 2020-01-23 NOTE — Telephone Encounter (Signed)
Requested medication (s) are due for refill today: Ultram, No  Requested medication (s) are on the active medication list: no  Last refill: ?  Future visit scheduled:yes  Notes to clinic:  Not delegated

## 2020-03-01 ENCOUNTER — Ambulatory Visit: Payer: BC Managed Care – PPO

## 2020-03-11 ENCOUNTER — Encounter: Payer: Self-pay | Admitting: Family Medicine

## 2020-03-11 ENCOUNTER — Other Ambulatory Visit: Payer: Self-pay

## 2020-03-11 ENCOUNTER — Ambulatory Visit: Payer: BC Managed Care – PPO | Admitting: Family Medicine

## 2020-03-11 VITALS — BP 128/80 | HR 89 | Temp 97.8°F | Resp 16 | Ht 64.0 in | Wt 159.2 lb

## 2020-03-11 DIAGNOSIS — M542 Cervicalgia: Secondary | ICD-10-CM

## 2020-03-11 DIAGNOSIS — G8929 Other chronic pain: Secondary | ICD-10-CM

## 2020-03-11 DIAGNOSIS — Z23 Encounter for immunization: Secondary | ICD-10-CM | POA: Diagnosis not present

## 2020-03-11 DIAGNOSIS — F410 Panic disorder [episodic paroxysmal anxiety] without agoraphobia: Secondary | ICD-10-CM

## 2020-03-11 DIAGNOSIS — F4310 Post-traumatic stress disorder, unspecified: Secondary | ICD-10-CM

## 2020-03-11 DIAGNOSIS — F3162 Bipolar disorder, current episode mixed, moderate: Secondary | ICD-10-CM

## 2020-03-11 DIAGNOSIS — Z79899 Other long term (current) drug therapy: Secondary | ICD-10-CM

## 2020-03-11 DIAGNOSIS — Z1231 Encounter for screening mammogram for malignant neoplasm of breast: Secondary | ICD-10-CM

## 2020-03-11 DIAGNOSIS — M797 Fibromyalgia: Secondary | ICD-10-CM

## 2020-03-11 DIAGNOSIS — Z0289 Encounter for other administrative examinations: Secondary | ICD-10-CM

## 2020-03-11 DIAGNOSIS — I10 Essential (primary) hypertension: Secondary | ICD-10-CM

## 2020-03-11 DIAGNOSIS — E785 Hyperlipidemia, unspecified: Secondary | ICD-10-CM

## 2020-03-11 DIAGNOSIS — Z1211 Encounter for screening for malignant neoplasm of colon: Secondary | ICD-10-CM

## 2020-03-11 DIAGNOSIS — M5441 Lumbago with sciatica, right side: Secondary | ICD-10-CM

## 2020-03-11 MED ORDER — TRAMADOL HCL 50 MG PO TABS: 50.0000 mg | ORAL_TABLET | Freq: Every day | ORAL | 1 refills | Status: DC

## 2020-03-11 MED ORDER — OMEPRAZOLE 40 MG PO CPDR: 40.0000 mg | DELAYED_RELEASE_CAPSULE | Freq: Every day | ORAL | 3 refills | Status: DC

## 2020-03-11 NOTE — Progress Notes (Signed)
Name: Nancy Lucero   MRN: [id number]    DOB: 03/20/1966   Date:03/11/2020       Progress Note  Subjective  Chief Complaint  Follow up   HPI    Bipolar disorder:She saw   Dr. Shea Evans and went a couple of times, but is now seeing Dr. Toy Care and getting Clonazepam, Zyprexa 7.5 mg and Prozac. She has episodes of vivid dreams and feels anxious all the time, but states medications seems to be working for her . She states mood is okay , still up and down . She states Zyprexa has helped level her mood out    HTN:bp has been controlled at home, no chest pain or palpitation.She denies side effects of medication   History of c-spine fusion: she had multiple surgeries in 2004, continues to have daily  pain on her neck, seen by Dr. Cari Caraway, she has tingling and numbness on left hand and also left feet. She does not want to go for a revision, she takes Tramadol 50 mg up to BID #60   Low back pain: today she has a dull ache across her lower back, denies radiculitis down right leg. She takes goody powder at times but is trying to avoid since she has noticed vomiting in am's and has been off omeprazole for months   FMS: she satesbody aches today is  5/10 she denies mental fogginess, she has daily muscle aches. She is out of Tramadol and needs a refill  GERD: she has been out of omeprazole and would like a prescription, she is waking up and vomiting intermittently. Discussed raising the head of the bed, avoid spicy food and eating before bed   Dyslipidemia: based on cardiovascular risk below, risk of heart attack and strokes is still low  The 10-year ASCVD risk score Mikey Bussing DC Brooke Bonito., et al., 2013) is: 7.2%   Values used to calculate the score:     Age: 54 years     Sex: Female     Is Non-Hispanic African American: No     Diabetic: No     Tobacco smoker: Yes     Systolic Blood Pressure: 409 mmHg     Is BP treated: Yes     HDL Cholesterol: 55 mg/dL     Total Cholesterol: 208 mg/dL  Patient  Active Problem List   Diagnosis Date Noted  . Bipolar 1 disorder, mixed, moderate ([place]) 11/30/2018  . Panic disorder 11/30/2018  . PTSD (post-traumatic stress disorder) 11/30/2018  . Insomnia due to medical condition 11/30/2018  . Torn meniscus 06/21/2018  . Hypertension 09/01/2017  . S/P cervical spinal fusion 12/01/2016  . Pain management contract signed 12/05/2015  . MVP (mitral valve prolapse) 08/28/2015  . Dyslipidemia 08/28/2015  . Anxiety 08/28/2015  . Bipolar disorder ([place]) 08/28/2015  . Cervical cancer screening 08/28/2015  . Elevated blood-pressure reading without diagnosis of hypertension 08/28/2015  . History of seizures 08/28/2015  . Syncopal episodes 08/28/2015  . Chronic back pain 10/20/2014  . Cervical radiculitis 10/20/2014  . Anorexia nervosa, restricting type 10/20/2014  . Fibromyalgia syndrome 10/20/2014  . Fibrocystic breast disease (FCBD) in female 10/20/2014  . Gastro-esophageal reflux disease without esophagitis 10/20/2014  . Irritable bowel syndrome (IBS) 10/20/2014  . Migraine without aura and responsive to treatment 10/20/2014  . Panic attacks 10/20/2014  . Spondylosis 10/20/2014  . Tobacco use 10/20/2014  . Mixed urge and stress incontinence 10/20/2014  . Atrophy of vagina 10/20/2014    Past Surgical History:  Procedure Laterality  Date  . ABDOMINAL HYSTERECTOMY    . APPENDECTOMY    . KNEE SURGERY Right 10/15/2015   [facility]-removed meniscus  . SPINAL FUSION     Neck 3/4/5  . TONSILLECTOMY    . TOOTH EXTRACTION  02/03/2016    Family History  Problem Relation Age of Onset  . Mental illness Mother   . Heart disease Mother   . Depression Son   . Mental illness Son   . Mental illness Maternal Aunt   . Depression Son   . Mental illness Son   . Depression Son   . Mental illness Son   . Congestive Heart Failure Father   . Atrial fibrillation Father   . Heart disease Father   . Mental illness Sister     Social History   Tobacco Use  .  Smoking status: Current Every Day Smoker    Years: 40.00    Types: E-cigarettes, Cigarettes    Start date: 05/04/1977  . Smokeless tobacco: Never Used  . Tobacco comment: smoking a couple daily again, she had quit   Substance Use Topics  . Alcohol use: Yes    Alcohol/week: 7.0 standard drinks    Types: 7 Standard drinks or equivalent per week    Comment: glass of wine/or 2 oz of alcohol every evening     Current Outpatient Medications:  .  amLODipine (NORVASC) 10 MG tablet, Take 1 tablet (10 mg total) by mouth daily., Disp: 90 tablet, Rfl: 1 .  aspirin EC 81 MG tablet, Take 81 mg by mouth., Disp: , Rfl:  .  b complex vitamins capsule, Take by mouth., Disp: , Rfl:  .  clonazePAM (KLONOPIN) 1 MG tablet, Take 1 mg by mouth 4 (four) times daily as needed., Disp: , Rfl:  .  FIBER PO, 1 serving daily., Disp: , Rfl:  .  FLUoxetine (PROZAC) 40 MG capsule, Take 1 capsule (40 mg total) by mouth daily with breakfast., Disp: 30 capsule, Rfl: 1 .  ibuprofen (ADVIL,MOTRIN) 800 MG tablet, Take by mouth., Disp: , Rfl:  .  metaxalone (SKELAXIN) 800 MG tablet, Take 1 tablet (800 mg total) by mouth 3 (three) times daily., Disp: 180 tablet, Rfl: 1 .  Nutritional Supplements (JUICE PLUS FIBRE PO), Take 1 capsule by mouth daily., Disp: , Rfl:  .  OLANZapine (ZYPREXA) 7.5 MG tablet, Take 7.5 mg by mouth at bedtime., Disp: , Rfl:  .  olmesartan (BENICAR) 40 MG tablet, Take 1 tablet (40 mg total) by mouth daily., Disp: 90 tablet, Rfl: 0 .  Omeprazole 20 MG TBEC, TAKE 1 TABLET BY MOUTH EVERY DAY, Disp: 84 tablet, Rfl: 0 .  Probiotic TBEC, Take by mouth., Disp: , Rfl:  .  SUMAtriptan (IMITREX) 100 MG tablet, Take 1 tablet (100 mg total) every 2 (two) hours as needed by mouth for migraine. May repeat in 2 hours if headache persists or recurs., Disp: 10 tablet, Rfl: 0 .  traMADol (ULTRAM) 50 MG tablet, Take 50 mg by mouth every 8 (eight) hours as needed., Disp: , Rfl:  .  calcium citrate-vitamin D (CITRACAL+D)  315-200 MG-UNIT tablet, Take by mouth. (Patient not taking: Reported on 03/11/2020), Disp: , Rfl:   Allergies  Allergen Reactions  . Cephalosporins Anaphylaxis  . Cortisone     blackouts  . Cephalexin   . Lyrica [Pregabalin]     Stutters with medication   . Penicillins   . Sulfa Antibiotics   . Sulfacetamide Sodium   . Fentanyl Nausea And Vomiting  blindness    I personally reviewed active problem list, medication list, allergies, family history, social history, health maintenance with the patient/caregiver today.   ROS  Constitutional: Negative for fever, positive for  weight change.  Respiratory: Negative for cough and shortness of breath.   Cardiovascular: Negative for chest pain or palpitations.  Gastrointestinal: Negative for abdominal pain, no bowel changes.  Musculoskeletal: Negative for gait problem or joint swelling.  Skin: Negative for rash.  Neurological: Negative for dizziness or headache.  No other specific complaints in a complete review of systems (except as listed in HPI above).  Objective  Vitals:   03/11/20 1519  BP: 128/80  Pulse: 89  Resp: 16  Temp: 97.8 F (36.6 C)  TempSrc: Oral  SpO2: 100%  Weight: 159 lb 3.2 oz (72.2 kg)  Height: 5\' 4"  (1.626 m)    Body mass index is 27.33 kg/m.  Physical Exam  Constitutional: Patient appears well-developed and well-nourished. Overweight. No distress.  HEENT: head atraumatic, normocephalic, pupils equal and reactive to light, neck supple Cardiovascular: Normal rate, regular rhythm and normal heart sounds.  No murmur heard. No BLE edema. Pulmonary/Chest: Effort normal and breath sounds normal. No respiratory distress. Abdominal: Soft.  There is no tenderness. Psychiatric: Patient has a normal mood and affect. behavior is normal. Judgment and thought content normal. Muscular Skeletal: trigger point positive, decrease rom of neck   PHQ2/9: Depression screen [facility] 2/9 03/11/2020 09/04/2019 03/24/2019  12/21/2018 09/19/2018  Decreased Interest 1 1 3 3 3   Down, Depressed, Hopeless 1 1 3 3 3   PHQ - 2 Score 2 2 6 6 6   Altered sleeping 1 3 0 3 3  Tired, decreased energy 1 2 3 3 3   Change in appetite 1 2 3 3 3   Feeling bad or failure about yourself  1 2 3 3 3   Trouble concentrating 0 1 3 3 3   Moving slowly or fidgety/restless 0 1 0 3 3  Suicidal thoughts 0 0 0 0 0  PHQ-9 Score 6 13 18 24 24   Difficult doing work/chores Very difficult - Very difficult Extremely dIfficult Extremely dIfficult  Some recent data might be hidden    phq 9 is positive   Fall Risk: Fall Risk  03/11/2020 09/04/2019 03/24/2019 12/21/2018 09/19/2018  Falls in the past year? 0 0 0 0 1  Number falls in past yr: 0 0 0 0 1  Comment - - - - -  Injury with Fall? 0 0 0 0 0  Comment - - - - -  Risk Factor Category  - - - - -  Risk for fall due to : - - - - -  Follow up - - - - -     Functional Status Survey: Is the patient deaf or have difficulty hearing?: No Does the patient have difficulty seeing, even when wearing glasses/contacts?: No Does the patient have difficulty concentrating, remembering, or making decisions?: Yes Does the patient have difficulty walking or climbing stairs?: No Does the patient have difficulty dressing or bathing?: No Does the patient have difficulty doing errands alone such as visiting a doctor's office or shopping?: No    Assessment & Plan  1. Bipolar 1 disorder, mixed, moderate ([facility])  Keep follow up with Dr. Toy Care   2. Need for Tdap vaccination  - Tdap vaccine greater than or equal to 7yo IM  3. Colon cancer screening  - Cologuard  4. Breast cancer screening by mammogram  - MM Digital Screening; Future  5.  PTSD (post-traumatic stress disorder)   6. Panic disorder   7. Fibromyalgia syndrome  Stable   8. Chronic neck pain  - traMADol (ULTRAM) 50 MG tablet; Take 1 tablet (50 mg total) by mouth daily.  Dispense: 90 tablet; Refill: 1  9. Hypertension, benign   10.  Dyslipidemia  - Lipid panel  11. Chronic bilateral low back pain with right-sided sciatica  - traMADol (ULTRAM) 50 MG tablet; Take 1 tablet (50 mg total) by mouth daily.  Dispense: 90 tablet; Refill: 1  12. Long-term use of high-risk medication  - CBC with Differential/Platelet - COMPLETE METABOLIC PANEL WITH GFR - Hemoglobin A1c

## 2020-03-12 LAB — CBC WITH DIFFERENTIAL/PLATELET
Absolute Monocytes: 856 cells/uL (ref 200–950)
Basophils Absolute: 102 cells/uL (ref 0–200)
Basophils Relative: 0.7 %
Eosinophils Absolute: 131 cells/uL (ref 15–500)
Eosinophils Relative: 0.9 %
HCT: 44 % (ref 35.0–45.0)
Hemoglobin: 14.7 g/dL (ref 11.7–15.5)
Lymphs Abs: 4031 cells/uL — ABNORMAL HIGH (ref 850–3900)
MCH: 30.2 pg (ref 27.0–33.0)
MCHC: 33.4 g/dL (ref 32.0–36.0)
MCV: 90.3 fL (ref 80.0–100.0)
MPV: 11.3 fL (ref 7.5–12.5)
Monocytes Relative: 5.9 %
Neutro Abs: 9382 cells/uL — ABNORMAL HIGH (ref 1500–7800)
Neutrophils Relative %: 64.7 %
Platelets: 294 10*3/uL (ref 140–400)
RBC: 4.87 10*6/uL (ref 3.80–5.10)
RDW: 14.9 % (ref 11.0–15.0)
Total Lymphocyte: 27.8 %
WBC: 14.5 10*3/uL — ABNORMAL HIGH (ref 3.8–10.8)

## 2020-03-12 LAB — COMPLETE METABOLIC PANEL WITH GFR
AG Ratio: 1.7 (calc) (ref 1.0–2.5)
ALT: 5 U/L — ABNORMAL LOW (ref 6–29)
AST: 8 U/L — ABNORMAL LOW (ref 10–35)
Albumin: 4.3 g/dL (ref 3.6–5.1)
Alkaline phosphatase (APISO): 89 U/L (ref 37–153)
BUN: 13 mg/dL (ref 7–25)
CO2: 28 mmol/L (ref 20–32)
Calcium: 9.3 mg/dL (ref 8.6–10.4)
Chloride: 103 mmol/L (ref 98–110)
Creat: 0.79 mg/dL (ref 0.50–1.05)
GFR, Est African American: 97 mL/min/{1.73_m2} (ref >=60)
GFR, Est Non African American: 84 mL/min/{1.73_m2} (ref >=60)
Globulin: 2.6 g/dL (calc) (ref 1.9–3.7)
Glucose, Bld: 93 mg/dL (ref 65–99)
Potassium: 4.2 mmol/L (ref 3.5–5.3)
Sodium: 141 mmol/L (ref 135–146)
Total Bilirubin: 0.3 mg/dL (ref 0.2–1.2)
Total Protein: 6.9 g/dL (ref 6.1–8.1)

## 2020-03-12 LAB — DRUG MONITOR,OPIATES,SCREEN,URINE: Opiates: NEGATIVE ng/mL (ref <100)

## 2020-03-12 LAB — DM TEMPLATE

## 2020-03-12 LAB — LIPID PANEL
Cholesterol: 275 mg/dL — ABNORMAL HIGH (ref <200)
HDL: 60 mg/dL (ref >=50)
LDL Cholesterol (Calc): 168 mg/dL (calc) — ABNORMAL HIGH
Non-HDL Cholesterol (Calc): 215 mg/dL (calc) — ABNORMAL HIGH (ref <130)
Total CHOL/HDL Ratio: 4.6 (calc) (ref <5.0)
Triglycerides: 287 mg/dL — ABNORMAL HIGH (ref <150)

## 2020-03-12 LAB — HEMOGLOBIN A1C
Hgb A1c MFr Bld: 5.2 % of total Hgb (ref <5.7)
Mean Plasma Glucose: 103 (calc)
eAG (mmol/L): 5.7 (calc)

## 2020-03-18 ENCOUNTER — Other Ambulatory Visit: Payer: Self-pay

## 2020-03-18 ENCOUNTER — Other Ambulatory Visit: Payer: Self-pay | Admitting: Family Medicine

## 2020-03-18 DIAGNOSIS — D729 Disorder of white blood cells, unspecified: Secondary | ICD-10-CM

## 2020-03-18 LAB — CBC WITH DIFFERENTIAL/PLATELET
Absolute Monocytes: 700 cells/uL (ref 200–950)
Basophils Absolute: 74 cells/uL (ref 0–200)
Basophils Relative: 0.7 %
Eosinophils Absolute: 95 cells/uL (ref 15–500)
Eosinophils Relative: 0.9 %
HCT: 44.1 % (ref 35.0–45.0)
Hemoglobin: 14.3 g/dL (ref 11.7–15.5)
Lymphs Abs: 2586 cells/uL (ref 850–3900)
MCH: 29 pg (ref 27.0–33.0)
MCHC: 32.4 g/dL (ref 32.0–36.0)
MCV: 89.5 fL (ref 80.0–100.0)
MPV: 11.6 fL (ref 7.5–12.5)
Monocytes Relative: 6.6 %
Neutro Abs: 7144 cells/uL (ref 1500–7800)
Neutrophils Relative %: 67.4 %
Platelets: 255 10*3/uL (ref 140–400)
RBC: 4.93 10*6/uL (ref 3.80–5.10)
RDW: 15 % (ref 11.0–15.0)
Total Lymphocyte: 24.4 %
WBC: 10.6 10*3/uL (ref 3.8–10.8)

## 2020-03-18 MED ORDER — ROSUVASTATIN CALCIUM 20 MG PO TABS: 20.0000 mg | ORAL_TABLET | Freq: Every day | ORAL | 1 refills | Status: DC

## 2020-03-20 ENCOUNTER — Other Ambulatory Visit: Payer: Self-pay

## 2020-03-20 ENCOUNTER — Emergency Department
Admission: EM | Admit: 2020-03-20 | Discharge: 2020-03-20 | Disposition: A | Discharge status: Home / Self Care | Payer: BC Managed Care – PPO | Attending: Emergency Medicine | Admitting: Emergency Medicine

## 2020-03-20 ENCOUNTER — Emergency Department: Payer: BC Managed Care – PPO

## 2020-03-20 DIAGNOSIS — E876 Hypokalemia: Secondary | ICD-10-CM | POA: Insufficient documentation

## 2020-03-20 DIAGNOSIS — I10 Essential (primary) hypertension: Secondary | ICD-10-CM | POA: Diagnosis not present

## 2020-03-20 DIAGNOSIS — R112 Nausea with vomiting, unspecified: Secondary | ICD-10-CM | POA: Diagnosis not present

## 2020-03-20 DIAGNOSIS — E860 Dehydration: Secondary | ICD-10-CM

## 2020-03-20 DIAGNOSIS — R1011 Right upper quadrant pain: Secondary | ICD-10-CM | POA: Diagnosis not present

## 2020-03-20 DIAGNOSIS — F17210 Nicotine dependence, cigarettes, uncomplicated: Secondary | ICD-10-CM | POA: Diagnosis not present

## 2020-03-20 DIAGNOSIS — Z79899 Other long term (current) drug therapy: Secondary | ICD-10-CM | POA: Diagnosis not present

## 2020-03-20 DIAGNOSIS — Z7982 Long term (current) use of aspirin: Secondary | ICD-10-CM | POA: Diagnosis not present

## 2020-03-20 DIAGNOSIS — R197 Diarrhea, unspecified: Secondary | ICD-10-CM | POA: Insufficient documentation

## 2020-03-20 LAB — URINALYSIS, COMPLETE (UACMP) WITH MICROSCOPIC
Glucose, UA: NEGATIVE mg/dL
Ketones, ur: 20 mg/dL — AB
Leukocytes,Ua: NEGATIVE
Nitrite: NEGATIVE
Protein, ur: 100 mg/dL — AB
Specific Gravity, Urine: 1.028 (ref 1.005–1.030)
pH: 5 (ref 5.0–8.0)

## 2020-03-20 LAB — HEPATIC FUNCTION PANEL
ALT: 8 U/L (ref 0–44)
AST: 18 U/L (ref 15–41)
Albumin: 4.4 g/dL (ref 3.5–5.0)
Alkaline Phosphatase: 74 U/L (ref 38–126)
Bilirubin, Direct: 0.1 mg/dL (ref 0.0–0.2)
Indirect Bilirubin: 0.8 mg/dL (ref 0.3–0.9)
Total Bilirubin: 0.9 mg/dL (ref 0.3–1.2)
Total Protein: 7.7 g/dL (ref 6.5–8.1)

## 2020-03-20 LAB — CBC
HCT: 42.7 % (ref 36.0–46.0)
Hemoglobin: 14.6 g/dL (ref 12.0–15.0)
MCH: 30.4 pg (ref 26.0–34.0)
MCHC: 34.2 g/dL (ref 30.0–36.0)
MCV: 88.8 fL (ref 80.0–100.0)
Platelets: 247 10*3/uL (ref 150–400)
RBC: 4.81 MIL/uL (ref 3.87–5.11)
RDW: 15.1 % (ref 11.5–15.5)
WBC: 11.6 10*3/uL — ABNORMAL HIGH (ref 4.0–10.5)
nRBC: 0 % (ref 0.0–0.2)

## 2020-03-20 LAB — BASIC METABOLIC PANEL
Anion gap: 16 — ABNORMAL HIGH (ref 5–15)
BUN: 15 mg/dL (ref 6–20)
CO2: 21 mmol/L — ABNORMAL LOW (ref 22–32)
Calcium: 9.2 mg/dL (ref 8.9–10.3)
Chloride: 101 mmol/L (ref 98–111)
Creatinine, Ser: 0.71 mg/dL (ref 0.44–1.00)
GFR, Estimated: >60 mL/min (ref >60)
Glucose, Bld: 106 mg/dL — ABNORMAL HIGH (ref 70–99)
Potassium: 3.1 mmol/L — ABNORMAL LOW (ref 3.5–5.1)
Sodium: 138 mmol/L (ref 135–145)

## 2020-03-20 MED ORDER — ONDANSETRON HCL 4 MG PO TABS: 4.0000 mg | ORAL_TABLET | Freq: Three times a day (TID) | ORAL | 0 refills | Status: DC | PRN

## 2020-03-20 MED ORDER — POTASSIUM CHLORIDE CRYS ER 20 MEQ PO TBCR
40.0000 meq | EXTENDED_RELEASE_TABLET | Freq: Once | ORAL | Status: AC
  Administered 2020-03-20: 40 meq via ORAL
  Filled 2020-03-20: qty 2

## 2020-03-20 MED ORDER — ONDANSETRON HCL 4 MG/2ML IJ SOLN
4.0000 mg | Freq: Once | INTRAMUSCULAR | Status: AC
  Administered 2020-03-20: 4 mg via INTRAVENOUS
  Filled 2020-03-20: qty 2

## 2020-03-20 MED ORDER — LACTATED RINGERS IV BOLUS
1000.0000 mL | Freq: Once | INTRAVENOUS | Status: AC
  Administered 2020-03-20: 1000 mL via INTRAVENOUS

## 2020-03-20 MED ORDER — IOHEXOL 300 MG/ML  SOLN
100.0000 mL | Freq: Once | INTRAMUSCULAR | Status: AC | PRN
  Administered 2020-03-20: 100 mL via INTRAVENOUS

## 2020-03-20 NOTE — ED Notes (Signed)
Signature pad unresponsive, pt verbally acknowledges d/c instructions

## 2020-03-20 NOTE — ED Triage Notes (Signed)
Pt here with right sided pain and diarrhea. Pt states that she has been vomiting and it had gradually gotten worse. Pt states that she went to her primary and her WBC were elevated and she is awaiting the rest of her results. Pt states that she is only vomiting mucus. Pt has hx of intestinal abscess and diverticula. Pt NAD in triage.

## 2020-03-20 NOTE — ED Notes (Signed)
Pt reports right abdominal/back pain.

## 2020-03-20 NOTE — ED Provider Notes (Signed)
[facility] Emergency Department Provider Note  ____________________________________________   First MD Initiated Contact with Patient 03/20/20 1914     (approximate)  I have reviewed the triage vital signs and the nursing notes.   HISTORY  Chief Complaint Flank Pain   HPI Nancy Lucero is a 54 y.o. female with a past medical history of bipolar affective disorder, fibromyalgia, GERD, IBS and and migraines who presents for assessment of 2 3 weeks of worsening generalized abdominal pain radiating from the right upper quadrant to the left lower quadrant into the back.  Patient states she has had daily nonbloody nonbilious vomiting primarily the morning as well as daily nonbloody diarrhea.  She states her pain is getting more severe especially the last couple of days.  Denies any headache, earache, sore throat, chest pain, cough, shortness of breath, back pain, rash, urinary symptoms or other acute complaints.  Denies any significant NSAID use but does endorse drinking 1-2 alcoholic beverages per night.  No other clear alleviating or aggravating factors and pain is not clearly related to meals.         Past Medical History:  Diagnosis Date  . Atrophic vaginitis   . Backache   . Bipolar affective ([place])   . Bipolar disorder current episode depressed ([place]) 10/20/2014  . Fibromyalgia   . GERD (gastroesophageal reflux disease)   . Hypoglycemia   . Irritable bowel syndrome with diarrhea   . Migraine without aura with status migrainosus     Patient Active Problem List   Diagnosis Date Noted  . Bipolar 1 disorder, mixed, moderate ([place]) 11/30/2018  . Panic disorder 11/30/2018  . PTSD (post-traumatic stress disorder) 11/30/2018  . Insomnia due to medical condition 11/30/2018  . Torn meniscus 06/21/2018  . Hypertension 09/01/2017  . S/P cervical spinal fusion 12/01/2016  . Pain management contract signed 12/05/2015  . MVP (mitral valve prolapse) 08/28/2015    . Dyslipidemia 08/28/2015  . Anxiety 08/28/2015  . Bipolar disorder ([place]) 08/28/2015  . Cervical cancer screening 08/28/2015  . Elevated blood-pressure reading without diagnosis of hypertension 08/28/2015  . History of seizures 08/28/2015  . Syncopal episodes 08/28/2015  . Chronic back pain 10/20/2014  . Cervical radiculitis 10/20/2014  . Anorexia nervosa, restricting type 10/20/2014  . Fibromyalgia syndrome 10/20/2014  . Fibrocystic breast disease (FCBD) in female 10/20/2014  . Gastro-esophageal reflux disease without esophagitis 10/20/2014  . Irritable bowel syndrome (IBS) 10/20/2014  . Migraine without aura and responsive to treatment 10/20/2014  . Panic attacks 10/20/2014  . Spondylosis 10/20/2014  . Tobacco use 10/20/2014  . Mixed urge and stress incontinence 10/20/2014  . Atrophy of vagina 10/20/2014    Past Surgical History:  Procedure Laterality Date  . ABDOMINAL HYSTERECTOMY    . APPENDECTOMY    . KNEE SURGERY Right 10/15/2015   [facility]-removed meniscus  . SPINAL FUSION     Neck 3/4/5  . TONSILLECTOMY    . TOOTH EXTRACTION  02/03/2016    Prior to Admission medications   Medication Sig Start Date End Date Taking? Authorizing Provider  amLODipine (NORVASC) 10 MG tablet Take 1 tablet (10 mg total) by mouth daily. 09/04/19   Steele Sizer, MD  aspirin EC 81 MG tablet Take 81 mg by mouth. 09/23/15   [provider]  b complex vitamins capsule Take by mouth.    [provider]  clonazePAM (KLONOPIN) 1 MG tablet Take 1 mg by mouth 4 (four) times daily as needed. 08/19/19   Chucky May, MD  FIBER PO 1 serving daily. 08/28/15   [provider]  FLUoxetine (PROZAC) 40 MG capsule Take 1 capsule (40 mg total) by mouth daily with breakfast. 01/19/19   Ursula Alert, MD  metaxalone (SKELAXIN) 800 MG tablet Take 1 tablet (800 mg total) by mouth 3 (three) times daily. 12/21/18   Steele Sizer, MD  Nutritional Supplements (JUICE PLUS FIBRE PO) Take 1  capsule by mouth daily.    [provider]  OLANZapine (ZYPREXA) 7.5 MG tablet Take 7.5 mg by mouth at bedtime. 08/31/19   [provider]  olmesartan (BENICAR) 40 MG tablet Take 1 tablet (40 mg total) by mouth daily. 01/23/20   Steele Sizer, MD  omeprazole (PRILOSEC) 40 MG capsule Take 1 capsule (40 mg total) by mouth daily. 03/11/20   Steele Sizer, MD  ondansetron (ZOFRAN) 4 MG tablet Take 1 tablet (4 mg total) by mouth every 8 (eight) hours as needed for up to 10 doses for nausea or vomiting. 03/20/20   Lucrezia Starch, MD  Probiotic TBEC Take by mouth. 06/27/14   [provider]  rosuvastatin (CRESTOR) 20 MG tablet Take 1 tablet (20 mg total) by mouth daily. 03/18/20   Steele Sizer, MD  SUMAtriptan (IMITREX) 100 MG tablet Take 1 tablet (100 mg total) every 2 (two) hours as needed by mouth for migraine. May repeat in 2 hours if headache persists or recurs. 03/17/17   Steele Sizer, MD  traMADol (ULTRAM) 50 MG tablet Take 1 tablet (50 mg total) by mouth daily. 03/11/20   Steele Sizer, MD    Allergies Cephalosporins, Cortisone, Cephalexin, Lyrica [pregabalin], Penicillins, Sulfa antibiotics, Sulfacetamide sodium, and Fentanyl  Family History  Problem Relation Age of Onset  . Mental illness Mother   . Heart disease Mother   . Depression Son   . Mental illness Son   . Mental illness Maternal Aunt   . Depression Son   . Mental illness Son   . Depression Son   . Mental illness Son   . Congestive Heart Failure Father   . Atrial fibrillation Father   . Heart disease Father   . Mental illness Sister     Social History Social History   Tobacco Use  . Smoking status: Current Every Day Smoker    Years: 40.00    Types: E-cigarettes, Cigarettes    Start date: 05/04/1977  . Smokeless tobacco: Never Used  . Tobacco comment: smoking a couple daily again, she had quit   Vaping Use  . Vaping Use: Some days  Substance Use Topics  . Alcohol use: Yes     Alcohol/week: 7.0 standard drinks    Types: 7 Standard drinks or equivalent per week    Comment: glass of wine/or 2 oz of alcohol every evening  . Drug use: No    Review of Systems  Review of Systems  Constitutional: Negative for chills and fever.  HENT: Negative for sore throat.   Eyes: Negative for pain.  Respiratory: Negative for cough and stridor.   Cardiovascular: Negative for chest pain.  Gastrointestinal: Positive for abdominal pain, diarrhea, nausea and vomiting.  Skin: Negative for rash.  Neurological: Negative for seizures, loss of consciousness and headaches.  Psychiatric/Behavioral: Negative for suicidal ideas.  All other systems reviewed and are negative.     ____________________________________________   PHYSICAL EXAM:  VITAL SIGNS: ED Triage Vitals  Enc Vitals Group     BP 03/20/20 1508 (!) 174/117     Pulse Rate 03/20/20 1508 94  Resp 03/20/20 1508 18     Temp 03/20/20 1508 98.6 F (37 C)     Temp src --      SpO2 03/20/20 1508 98 %     Weight 03/20/20 1509 159 lb (72.1 kg)     Height 03/20/20 1509 5\' 4"  (1.626 m)     Head Circumference --      Peak Flow --      Pain Score 03/20/20 1508 10     Pain Loc --      Pain Edu? --      Excl. in [place]? --    Vitals:   03/20/20 2100 03/20/20 2140  BP: (!) 135/91 (!) 135/91  Pulse: 79 79  Resp:  16  Temp:  98.6 F (37 C)  SpO2: 96% 97%   Physical Exam Vitals and nursing note reviewed.  Constitutional:      General: She is not in acute distress.    Appearance: She is well-developed.  HENT:     Head: Normocephalic and atraumatic.     Right Ear: External ear normal.     Left Ear: External ear normal.     Nose: Nose normal.     Mouth/Throat:     Mouth: Mucous membranes are dry.  Eyes:     Conjunctiva/sclera: Conjunctivae normal.  Cardiovascular:     Rate and Rhythm: Normal rate and regular rhythm.     Heart sounds: No murmur heard.   Pulmonary:     Effort: Pulmonary effort is normal. No  respiratory distress.     Breath sounds: Normal breath sounds.  Abdominal:     Palpations: Abdomen is soft.     Tenderness: There is abdominal tenderness in the right upper quadrant. There is no right CVA tenderness or left CVA tenderness.  Musculoskeletal:     Cervical back: Neck supple.  Skin:    General: Skin is warm and dry.     Capillary Refill: Capillary refill takes 2 to 3 seconds.  Neurological:     Mental Status: She is alert and oriented to person, place, and time.  Psychiatric:        Mood and Affect: Mood normal.      ____________________________________________   LABS (all labs ordered are listed, but only abnormal results are displayed)  Labs Reviewed  URINALYSIS, COMPLETE (UACMP) WITH MICROSCOPIC - Abnormal; Notable for the following components:      Result Value   Color, Urine YELLOW (*)    APPearance TURBID (*)    Hgb urine dipstick SMALL (*)    Bilirubin Urine SMALL (*)    Ketones, ur 20 (*)    Protein, ur 100 (*)    Bacteria, UA MANY (*)    All other components within normal limits  BASIC METABOLIC PANEL - Abnormal; Notable for the following components:   Potassium 3.1 (*)    CO2 21 (*)    Glucose, Bld 106 (*)    Anion gap 16 (*)    All other components within normal limits  CBC - Abnormal; Notable for the following components:   WBC 11.6 (*)    All other components within normal limits  GASTROINTESTINAL PANEL BY PCR, STOOL (REPLACES STOOL CULTURE)  C DIFFICILE QUICK SCREEN W PCR REFLEX  HEPATIC FUNCTION PANEL  H. PYLORI ANTIGEN, STOOL   ____________________________________________  ____________________________________________  RADIOLOGY   Official radiology report(s): CT ABDOMEN PELVIS W CONTRAST  Result Date: 03/20/2020 CLINICAL DATA:  54 year old female with abdominal pain. Right side pain  and diarrhea. Leukocytosis. EXAM: CT ABDOMEN AND PELVIS WITH CONTRAST TECHNIQUE: Multidetector CT imaging of the abdomen and pelvis was performed  using the standard protocol following bolus administration of intravenous contrast. CONTRAST:  154mL OMNIPAQUE IOHEXOL 300 MG/ML  SOLN COMPARISON:  CT Abdomen and Pelvis 01/07/2010. FINDINGS: Lower chest: A small left lower lobe lung nodule on series 4, image 8 was present in 2011 and is stable, benign. Patchy additional nonspecific subpleural opacity at the lung bases, most pronounced at the lingula (series 4, image 11). No pleural effusion. No pericardial or pleural effusion. Hepatobiliary: Negative liver and gallbladder. No bile duct enlargement. Pancreas: Negative. Spleen: Negative. Several small splenules are stable and normal variation. Adrenals/Urinary Tract: Normal adrenal glands. Bilateral renal enhancement and contrast excretion is symmetric and within normal limits. No nephrolithiasis. Decompressed and normal ureters. Diminutive and unremarkable urinary bladder. Incidental pelvic phleboliths. Stomach/Bowel: Sigmoid diverticulosis. Decompressed descending and sigmoid colon with no definite active inflammation. Redundant transverse colon. Oral contrast has reached the hepatic flexure. No convincing large bowel inflammation. Decompressed terminal ileum. Appendix not identified and could be diminutive or absent. No dilated small bowel. Decompressed stomach. No free air, free fluid, or focal mesenteric stranding. Vascular/Lymphatic: Aortoiliac calcified atherosclerosis. Major arterial structures are patent. Portal venous system is patent. No lymphadenopathy. Reproductive: Surgically absent uterus. Diminutive or absent ovaries. Other: No pelvic free fluid. Musculoskeletal: Chronic left posterior 8th and 9th rib fractures. Advanced disc degeneration at the lumbosacral junction with vacuum disc. No acute osseous abnormality identified. IMPRESSION: 1. No acute or inflammatory process identified in the abdomen or pelvis. 2. Mild patchy opacity at the lung bases is nonspecific and could be mild atelectasis or  scarring, but early respiratory infection is difficult to exclude. No pleural effusion. 3. Sigmoid diverticulosis.   Aortic Atherosclerosis (ICD10-I70.0). Electronically Signed   By: Genevie Ann M.D.   On: 03/20/2020 21:33    ____________________________________________   PROCEDURES  Procedure(s) performed (including Critical Care):  .1-3 Lead EKG Interpretation Performed by: Lucrezia Starch, MD Authorized by: Lucrezia Starch, MD     Interpretation: normal     ECG rate assessment: normal     Rhythm: sinus rhythm     Ectopy: none     Conduction: normal       ____________________________________________   INITIAL IMPRESSION / ASSESSMENT AND PLAN / ED COURSE        Patient presents with [place] to history exam for assessment of progressively worsening right upper quadrant pain with daily nonbloody nonbilious vomiting and daily diarrhea.  Patient is afebrile hemodynamically stable.  Exam as above remarkable for evidence of some dehydration as well as tenderness on upper quadrant.  Differential includes but is not limited to acute cholecystitis, choledocholithiasis, hepatitis, pyelonephritis, pancreatitis, diverticulitis, and gastritis versus peptic ulcer disease.  CT abdomen pelvis shows no evidence of any acute intra-abdominal or pelvic process including evidence of acute cholecystitis, cholelithiasis, pyelonephritis, pancreatitis, diverticulitis, SBO or perforated viscus.  Patient does have some nonspecific atelectasis at the lung bases and atherosclerosis.  BMP shows mild hypokalemia with K of 3.1 and very slight anion gap acidosis.  I suspect this is related to some dehydration.  CBC shows mild leukocytosis with WBC count of 11.6 and otherwise no significant derangements.  Hepatic function panel unremarkable.  Patient unable for stool sample for GI pathogen or C. difficile testing.  Discharged in stable condition after IVF hydration. Discussed recommendation for GI f/u given duration of  symptoms.    ____________________________________________   FINAL CLINICAL  IMPRESSION(S) / ED DIAGNOSES  Final diagnoses:  Nausea vomiting and diarrhea  RUQ pain  Hypokalemia    Medications  lactated ringers bolus 1,000 mL (0 mLs Intravenous Stopped 03/20/20 2139)  ondansetron (ZOFRAN) injection 4 mg (4 mg Intravenous Given 03/20/20 2001)  potassium chloride SA (KLOR-CON) CR tablet 40 mEq (40 mEq Oral Given 03/20/20 2009)  iohexol (OMNIPAQUE) 300 MG/ML solution 100 mL (100 mLs Intravenous Contrast Given 03/20/20 2113)     ED Discharge Orders         Ordered    ondansetron (ZOFRAN) 4 MG tablet  Every 8 hours PRN        03/20/20 2138           Note:  This document was prepared using Dragon voice recognition software and may include unintentional dictation errors.   Lucrezia Starch, MD 03/21/20 [phone]

## 2020-03-20 NOTE — ED Notes (Signed)
Pt to CT

## 2020-05-09 DIAGNOSIS — Z87898 Personal history of other specified conditions: Secondary | ICD-10-CM | POA: Insufficient documentation

## 2020-05-09 DIAGNOSIS — R1319 Other dysphagia: Secondary | ICD-10-CM | POA: Insufficient documentation

## 2020-05-24 ENCOUNTER — Encounter: Payer: BC Managed Care – PPO | Admitting: Family Medicine

## 2020-06-14 NOTE — Progress Notes (Deleted)
Name: Nancy Lucero   MRN: [id number]    DOB: Nov 07, 1965   Date:06/14/2020       Progress Note  Subjective  Chief Complaint  Follow Up  HPI  Bipolar disorder:She saw   Dr. Shea Evans and went a couple of times, but is now seeing Dr. Toy Care and getting Clonazepam, Zyprexa 7.5 mg and Prozac. She has episodes of vivid dreams and feels anxious all the time, but states medications seems to be working for her . She states mood is okay , still up and down . She states Zyprexa has helped level her mood out    HTN:bp has been controlled at home, no chest pain or palpitation.She denies side effects of medication   History of c-spine fusion: she had multiple surgeries in 2004, continues to have daily  pain on her neck, seen by Dr. Cari Caraway, she has tingling and numbness on left hand and also left feet. She does not want to go for a revision, she takes Tramadol 50 mg up to BID #60   Low back pain: today she has a dull ache across her lower back, denies radiculitis down right leg. She takes goody powder at times but is trying to avoid since she has noticed vomiting in am's and has been off omeprazole for months   FMS: she satesbody aches today is  5/10 she denies mental fogginess, she has daily muscle aches. She is out of Tramadol and needs a refill  GERD: she has been out of omeprazole and would like a prescription, she is waking up and vomiting intermittently. Discussed raising the head of the bed, avoid spicy food and eating before bed   Dyslipidemia: based on cardiovascular risk below, risk of heart attack and strokes is still low  The 10-year ASCVD risk score Mikey Bussing DC Brooke Bonito., et al., 2013) is: 7.2%   Values used to calculate the score:     Age: 55 years     Sex: Female     Is Non-Hispanic African American: No     Diabetic: No     Tobacco smoker: Yes     Systolic Blood Pressure: 633 mmHg     Is BP treated: Yes     HDL Cholesterol: 55 mg/dL     Total Cholesterol: 208 mg/dL  Patient  Active Problem List   Diagnosis Date Noted  . Bipolar 1 disorder, mixed, moderate ([place]) 11/30/2018  . Panic disorder 11/30/2018  . PTSD (post-traumatic stress disorder) 11/30/2018  . Insomnia due to medical condition 11/30/2018  . Torn meniscus 06/21/2018  . Hypertension 09/01/2017  . S/P cervical spinal fusion 12/01/2016  . Pain management contract signed 12/05/2015  . MVP (mitral valve prolapse) 08/28/2015  . Dyslipidemia 08/28/2015  . Anxiety 08/28/2015  . Bipolar disorder ([place]) 08/28/2015  . Cervical cancer screening 08/28/2015  . Elevated blood-pressure reading without diagnosis of hypertension 08/28/2015  . History of seizures 08/28/2015  . Syncopal episodes 08/28/2015  . Chronic back pain 10/20/2014  . Cervical radiculitis 10/20/2014  . Anorexia nervosa, restricting type 10/20/2014  . Fibromyalgia syndrome 10/20/2014  . Fibrocystic breast disease (FCBD) in female 10/20/2014  . Gastro-esophageal reflux disease without esophagitis 10/20/2014  . Irritable bowel syndrome (IBS) 10/20/2014  . Migraine without aura and responsive to treatment 10/20/2014  . Panic attacks 10/20/2014  . Spondylosis 10/20/2014  . Tobacco use 10/20/2014  . Mixed urge and stress incontinence 10/20/2014  . Atrophy of vagina 10/20/2014    Past Surgical History:  Procedure Laterality Date  .  ABDOMINAL HYSTERECTOMY    . APPENDECTOMY    . KNEE SURGERY Right 10/15/2015   [facility]-removed meniscus  . SPINAL FUSION     Neck 3/4/5  . TONSILLECTOMY    . TOOTH EXTRACTION  02/03/2016    Family History  Problem Relation Age of Onset  . Mental illness Mother   . Heart disease Mother   . Depression Son   . Mental illness Son   . Mental illness Maternal Aunt   . Depression Son   . Mental illness Son   . Depression Son   . Mental illness Son   . Congestive Heart Failure Father   . Atrial fibrillation Father   . Heart disease Father   . Mental illness Sister     Social History   Tobacco Use  .  Smoking status: Current Every Day Smoker    Years: 40.00    Types: E-cigarettes, Cigarettes    Start date: 05/04/1977  . Smokeless tobacco: Never Used  . Tobacco comment: smoking a couple daily again, she had quit   Substance Use Topics  . Alcohol use: Yes    Alcohol/week: 7.0 standard drinks    Types: 7 Standard drinks or equivalent per week    Comment: glass of wine/or 2 oz of alcohol every evening     Current Outpatient Medications:  .  amLODipine (NORVASC) 10 MG tablet, Take 1 tablet (10 mg total) by mouth daily., Disp: 90 tablet, Rfl: 1 .  aspirin EC 81 MG tablet, Take 81 mg by mouth., Disp: , Rfl:  .  b complex vitamins capsule, Take by mouth., Disp: , Rfl:  .  clonazePAM (KLONOPIN) 1 MG tablet, Take 1 mg by mouth 4 (four) times daily as needed., Disp: , Rfl:  .  FIBER PO, 1 serving daily., Disp: , Rfl:  .  FLUoxetine (PROZAC) 40 MG capsule, Take 1 capsule (40 mg total) by mouth daily with breakfast., Disp: 30 capsule, Rfl: 1 .  metaxalone (SKELAXIN) 800 MG tablet, Take 1 tablet (800 mg total) by mouth 3 (three) times daily., Disp: 180 tablet, Rfl: 1 .  Nutritional Supplements (JUICE PLUS FIBRE PO), Take 1 capsule by mouth daily., Disp: , Rfl:  .  OLANZapine (ZYPREXA) 7.5 MG tablet, Take 7.5 mg by mouth at bedtime., Disp: , Rfl:  .  olmesartan (BENICAR) 40 MG tablet, Take 1 tablet (40 mg total) by mouth daily., Disp: 90 tablet, Rfl: 0 .  omeprazole (PRILOSEC) 40 MG capsule, Take 1 capsule (40 mg total) by mouth daily., Disp: 30 capsule, Rfl: 3 .  ondansetron (ZOFRAN) 4 MG tablet, Take 1 tablet (4 mg total) by mouth every 8 (eight) hours as needed for up to 10 doses for nausea or vomiting., Disp: 10 tablet, Rfl: 0 .  Probiotic TBEC, Take by mouth., Disp: , Rfl:  .  rosuvastatin (CRESTOR) 20 MG tablet, Take 1 tablet (20 mg total) by mouth daily., Disp: 90 tablet, Rfl: 1 .  SUMAtriptan (IMITREX) 100 MG tablet, Take 1 tablet (100 mg total) every 2 (two) hours as needed by mouth for  migraine. May repeat in 2 hours if headache persists or recurs., Disp: 10 tablet, Rfl: 0 .  traMADol (ULTRAM) 50 MG tablet, Take 1 tablet (50 mg total) by mouth daily., Disp: 90 tablet, Rfl: 1  Allergies  Allergen Reactions  . Cephalosporins Anaphylaxis  . Cortisone     blackouts  . Cephalexin   . Lyrica [Pregabalin]     Stutters with medication   .  Penicillins   . Sulfa Antibiotics   . Sulfacetamide Sodium   . Fentanyl Nausea And Vomiting    blindness    I personally reviewed {Reviewed:14835} with the patient/caregiver today.   ROS  ***  Objective  There were no vitals filed for this visit.  There is no height or weight on file to calculate BMI.  Physical Exam ***  Recent Results (from the past 2160 hour(s))  CBC with Differential     Status: None   Collection Time: 03/18/20  2:09 PM  Result Value Ref Range   WBC 10.6 3.8 - 10.8 Thousand/uL   RBC 4.93 3.80 - 5.10 Million/uL   Hemoglobin 14.3 11.7 - 15.5 g/dL   HCT 44.1 35.0 - 45.0 %   MCV 89.5 80.0 - 100.0 fL   MCH 29.0 27.0 - 33.0 pg   MCHC 32.4 32.0 - 36.0 g/dL   RDW 15.0 11.0 - 15.0 %   Platelets 255 140 - 400 Thousand/uL   MPV 11.6 7.5 - 12.5 fL   Neutro Abs 7,144 1,500 - 7,800 cells/uL   Lymphs Abs 2,586 850 - 3,900 cells/uL   Absolute Monocytes 700 200 - 950 cells/uL   Eosinophils Absolute 95 15 - 500 cells/uL   Basophils Absolute 74 0 - 200 cells/uL   Neutrophils Relative % 67.4 %   Total Lymphocyte 24.4 %   Monocytes Relative 6.6 %   Eosinophils Relative 0.9 %   Basophils Relative 0.7 %  Urinalysis, Complete w Microscopic Urine, Clean Catch     Status: Abnormal   Collection Time: 03/20/20  3:11 PM  Result Value Ref Range   Color, Urine YELLOW (A) YELLOW   APPearance TURBID (A) CLEAR   Specific Gravity, Urine 1.028 1.005 - 1.030   pH 5.0 5.0 - 8.0   Glucose, UA NEGATIVE NEGATIVE mg/dL   Hgb urine dipstick SMALL (A) NEGATIVE   Bilirubin Urine SMALL (A) NEGATIVE   Ketones, ur 20 (A) NEGATIVE  mg/dL   Protein, ur 100 (A) NEGATIVE mg/dL   Nitrite NEGATIVE NEGATIVE   Leukocytes,Ua NEGATIVE NEGATIVE   WBC, UA 11-20 0 - 5 WBC/hpf   Bacteria, UA MANY (A) NONE SEEN   Squamous Epithelial / LPF 21-50 0 - 5   Mucus PRESENT     Comment: Performed at [facility], [address] [place], [place] [address]  Basic metabolic panel     Status: Abnormal   Collection Time: 03/20/20  3:11 PM  Result Value Ref Range   Sodium 138 135 - 145 mmol/L   Potassium 3.1 (L) 3.5 - 5.1 mmol/L   Chloride 101 98 - 111 mmol/L   CO2 21 (L) 22 - 32 mmol/L   Glucose, Bld 106 (H) 70 - 99 mg/dL    Comment: Glucose reference range applies only to samples taken after fasting for at least 8 hours.   BUN 15 6 - 20 mg/dL   Creatinine, Ser 0.71 0.44 - 1.00 mg/dL   Calcium 9.2 8.9 - 10.3 mg/dL   GFR, Estimated >60 >60 mL/min    Comment: (NOTE) Calculated using the CKD-EPI Creatinine Equation (2021)    Anion gap 16 (H) 5 - 15    Comment: Performed at [facility], [address] [place], [place] [address]  CBC     Status: Abnormal   Collection Time: 03/20/20  3:11 PM  Result Value Ref Range   WBC 11.6 (H) 4.0 - 10.5 K/uL   RBC 4.81 3.87 - 5.11 MIL/uL  Hemoglobin 14.6 12.0 - 15.0 g/dL   HCT 42.7 36.0 - 46.0 %   MCV 88.8 80.0 - 100.0 fL   MCH 30.4 26.0 - 34.0 pg   MCHC 34.2 30.0 - 36.0 g/dL   RDW 15.1 11.5 - 15.5 %   Platelets 247 150 - 400 K/uL   nRBC 0.0 0.0 - 0.2 %    Comment: Performed at [facility], [address] [place], [place] [address]  Hepatic function panel     Status: None   Collection Time: 03/20/20  3:11 PM  Result Value Ref Range   Total Protein 7.7 6.5 - 8.1 g/dL   Albumin 4.4 3.5 - 5.0 g/dL   AST 18 15 - 41 U/L   ALT 8 0 - 44 U/L   Alkaline Phosphatase 74 38 - 126 U/L   Total Bilirubin 0.9 0.3 - 1.2 mg/dL   Bilirubin, Direct 0.1 0.0 - 0.2 mg/dL   Indirect Bilirubin 0.8 0.3 - 0.9 mg/dL    Comment: Performed at [facility], [address] [place], [place] [address]    Diabetic Foot Exam: Diabetic Foot Exam - Simple   No data filed    ***  PHQ2/9: Depression screen [facility] 2/9 03/11/2020 09/04/2019 03/24/2019 12/21/2018 09/19/2018  Decreased Interest 1 1 3 3 3   Down, Depressed, Hopeless 1 1 3 3 3   PHQ - 2 Score 2 2 6 6 6   Altered sleeping 1 3 0 3 3  Tired, decreased energy 1 2 3 3 3   Change in appetite 1 2 3 3 3   Feeling bad or failure about yourself  1 2 3 3 3   Trouble concentrating 0 1 3 3 3   Moving slowly or fidgety/restless 0 1 0 3 3  Suicidal thoughts 0 0 0 0 0  PHQ-9 Score 6 13 18 24 24   Difficult doing work/chores Very difficult - Very difficult Extremely dIfficult Extremely dIfficult  Some recent data might be hidden    phq 9 is {gen pos PYK:998338} ***  Fall Risk: Fall Risk  03/11/2020 09/04/2019 03/24/2019 12/21/2018 09/19/2018  Falls in the past year? 0 0 0 0 1  Number falls in past yr: 0 0 0 0 1  Comment - - - - -  Injury with Fall? 0 0 0 0 0  Comment - - - - -  Risk Factor Category  - - - - -  Risk for fall due to : - - - - -  Follow up - - - - -   ***   Functional Status Survey:   ***   Assessment & Plan  *** There are no diagnoses linked to this encounter.

## 2020-06-17 ENCOUNTER — Ambulatory Visit: Payer: BC Managed Care – PPO | Admitting: Family Medicine

## 2020-10-05 ENCOUNTER — Other Ambulatory Visit: Payer: Self-pay | Admitting: Family Medicine

## 2020-10-05 DIAGNOSIS — G8929 Other chronic pain: Secondary | ICD-10-CM

## 2020-10-06 NOTE — Telephone Encounter (Signed)
Requested medication (s) are due for refill today: yes  Requested medication (s) are on the active medication list: yes  Last refill:  03/11/20  Future visit scheduled: no  Notes to clinic:  med not delegated to NT to RF   Requested Prescriptions  Pending Prescriptions Disp Refills   traMADol (ULTRAM) 50 MG tablet [Pharmacy Med Name: TRAMADOL 50MG  TABLETS] 90 tablet     Sig: TAKE 1 TABLET(50 MG) BY MOUTH DAILY      Not Delegated - Analgesics:  Opioid Agonists Failed - 10/05/2020  8:12 PM      Failed - This refill cannot be delegated      Failed - Urine Drug Screen completed in last 360 days      Failed - Valid encounter within last 6 months    Recent Outpatient Visits           6 months ago Bipolar 1 disorder, mixed, moderate ([place])   [facility] Steele Sizer, MD   1 year ago Chronic neck pain   [facility] Steele Sizer, MD   1 year ago Cervical radiculitis   [facility] Steele Sizer, MD   1 year ago Bipolar 1 disorder, mixed, moderate [facility])   [facility] [place], Drue Stager, MD   2 years ago Bipolar affective disorder, currently depressed, moderate [facility])   [facility] Steele Sizer, MD

## 2020-10-07 NOTE — Telephone Encounter (Signed)
Pt states she will call back to schedule an appt.   

## 2020-11-07 ENCOUNTER — Other Ambulatory Visit: Payer: Self-pay | Admitting: Family Medicine

## 2020-11-07 NOTE — Telephone Encounter (Signed)
Requested medication (s) are due for refill today: yes  Requested medication (s) are on the active medication list: yes  Last refill:  08/13/2020  Future visit scheduled:no  Notes to clinic:  Patient no was a no show to appt on 06/17/2020 Review for refill    Requested Prescriptions  Pending Prescriptions Disp Refills   rosuvastatin (CRESTOR) 20 MG tablet [Pharmacy Med Name: ROSUVASTATIN 20MG  TABLETS] 90 tablet 1    Sig: TAKE 1 TABLET(20 MG) BY MOUTH DAILY      Cardiovascular:  Antilipid - Statins Failed - 11/07/2020 10:27 AM      Failed - Total Cholesterol in normal range and within 360 days    Cholesterol, Total  Date Value Ref Range Status  12/14/2018 208 (H) 100 - 199 mg/dL Final   Cholesterol  Date Value Ref Range Status  03/11/2020 275 (H) <200 mg/dL Final          Failed - LDL in normal range and within 360 days    LDL Cholesterol (Calc)  Date Value Ref Range Status  03/11/2020 168 (H) mg/dL (calc) Final    Comment:    Reference range: <100 . Desirable range <100 mg/dL for primary prevention;   <70 mg/dL for patients with CHD or diabetic patients  with > or = 2 CHD risk factors. Marland Kitchen LDL-C is now calculated using the Martin-Hopkins  calculation, which is a validated novel method providing  better accuracy than the Friedewald equation in the  estimation of LDL-C.  Cresenciano Genre et al. Annamaria Helling. 0355;974(16): 2061-2068  (http://education.[facility].com/faq/FAQ164)           Failed - Triglycerides in normal range and within 360 days    Triglycerides  Date Value Ref Range Status  03/11/2020 287 (H) <150 mg/dL Final    Comment:    . If a non-fasting specimen was collected, consider repeat triglyceride testing on a fasting specimen if clinically indicated.  Yates Decamp et al. J. of Clin. Lipidol. 3845;3:646-803. Marland Kitchen           Passed - HDL in normal range and within 360 days    HDL  Date Value Ref Range Status  03/11/2020 60 > OR = 50 mg/dL Final  12/14/2018 55  >39 mg/dL Final          Passed - Patient is not pregnant      Passed - Valid encounter within last 12 months    Recent Outpatient Visits           8 months ago Bipolar 1 disorder, mixed, moderate ([place])   [facility] Steele Sizer, MD   1 year ago Chronic neck pain   [facility] Steele Sizer, MD   1 year ago Cervical radiculitis   [facility] Steele Sizer, MD   1 year ago Bipolar 1 disorder, mixed, moderate [facility])   [facility] [place], Drue Stager, MD   2 years ago Bipolar affective disorder, currently depressed, moderate [facility])   [facility] Steele Sizer, MD

## 2020-11-07 NOTE — Telephone Encounter (Signed)
Last seen 11.8.2021 no upcoming appt sch'd

## 2020-11-08 ENCOUNTER — Other Ambulatory Visit: Payer: Self-pay | Admitting: Family Medicine

## 2020-11-14 ENCOUNTER — Other Ambulatory Visit: Payer: Self-pay | Admitting: Family Medicine
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
## 2020-12-16 ENCOUNTER — Encounter: Payer: Self-pay | Admitting: Family Medicine

## 2020-12-16 ENCOUNTER — Ambulatory Visit: Payer: BC Managed Care – PPO | Admitting: Family Medicine

## 2020-12-16 ENCOUNTER — Other Ambulatory Visit: Payer: Self-pay

## 2020-12-16 VITALS — BP 136/82 | HR 94 | Temp 98.2°F | Resp 16 | Ht 64.0 in | Wt 152.0 lb

## 2020-12-16 DIAGNOSIS — F410 Panic disorder [episodic paroxysmal anxiety] without agoraphobia: Secondary | ICD-10-CM

## 2020-12-16 DIAGNOSIS — M797 Fibromyalgia: Secondary | ICD-10-CM

## 2020-12-16 DIAGNOSIS — G43109 Migraine with aura, not intractable, without status migrainosus: Secondary | ICD-10-CM

## 2020-12-16 DIAGNOSIS — G8929 Other chronic pain: Secondary | ICD-10-CM

## 2020-12-16 DIAGNOSIS — F3162 Bipolar disorder, current episode mixed, moderate: Secondary | ICD-10-CM | POA: Diagnosis not present

## 2020-12-16 DIAGNOSIS — F4310 Post-traumatic stress disorder, unspecified: Secondary | ICD-10-CM | POA: Diagnosis not present

## 2020-12-16 DIAGNOSIS — E785 Hyperlipidemia, unspecified: Secondary | ICD-10-CM

## 2020-12-16 DIAGNOSIS — M542 Cervicalgia: Secondary | ICD-10-CM

## 2020-12-16 DIAGNOSIS — Z23 Encounter for immunization: Secondary | ICD-10-CM | POA: Diagnosis not present

## 2020-12-16 DIAGNOSIS — I10 Essential (primary) hypertension: Secondary | ICD-10-CM

## 2020-12-16 DIAGNOSIS — M5441 Lumbago with sciatica, right side: Secondary | ICD-10-CM

## 2020-12-16 DIAGNOSIS — K219 Gastro-esophageal reflux disease without esophagitis: Secondary | ICD-10-CM

## 2020-12-16 DIAGNOSIS — M5431 Sciatica, right side: Secondary | ICD-10-CM

## 2020-12-16 MED ORDER — TRAMADOL HCL 50 MG PO TABS: 50.0000 mg | ORAL_TABLET | Freq: Every day | ORAL | 1 refills | Status: DC

## 2020-12-16 MED ORDER — ROSUVASTATIN CALCIUM 20 MG PO TABS: ORAL_TABLET | ORAL | 0 refills | Status: DC

## 2020-12-16 MED ORDER — METAXALONE 800 MG PO TABS: 800.0000 mg | ORAL_TABLET | Freq: Three times a day (TID) | ORAL | 1 refills | Status: DC

## 2020-12-16 MED ORDER — UBRELVY 100 MG PO TABS: 1.0000 | ORAL_TABLET | Freq: Every day | ORAL | 1 refills | Status: DC | PRN

## 2020-12-16 MED ORDER — OMEPRAZOLE 40 MG PO CPDR: 40.0000 mg | DELAYED_RELEASE_CAPSULE | Freq: Every day | ORAL | 1 refills | Status: DC

## 2020-12-16 MED ORDER — AMLODIPINE BESYLATE 2.5 MG PO TABS: 2.5000 mg | ORAL_TABLET | Freq: Every day | ORAL | 0 refills | Status: DC

## 2020-12-16 MED ORDER — OLMESARTAN MEDOXOMIL 40 MG PO TABS: 40.0000 mg | ORAL_TABLET | Freq: Every day | ORAL | 0 refills | Status: DC

## 2020-12-19 ENCOUNTER — Telehealth: Payer: Self-pay

## 2020-12-19 NOTE — Telephone Encounter (Signed)
Copied from [place] [id number]. Topic: General - Other >> Dec 18, 2020  4:37 PM Pawlus, Brayton Layman A wrote: Reason for CRM: Pt called in requesting to speak with Dr Ancil Boozer regarding her medication traMADol (ULTRAM) 50 MG tablet.

## 2020-12-19 NOTE — Telephone Encounter (Signed)
Line kept ringing. No vm to return call

## 2021-02-25 ENCOUNTER — Other Ambulatory Visit: Payer: Self-pay | Admitting: Family Medicine

## 2021-02-25 DIAGNOSIS — I10 Essential (primary) hypertension: Secondary | ICD-10-CM

## 2021-02-25 NOTE — Telephone Encounter (Signed)
Requested Prescriptions  Pending Prescriptions Disp Refills  . olmesartan (BENICAR) 40 MG tablet [Pharmacy Med Name: OLMESARTAN MEDOXOMIL 40MG  TABLETS] 90 tablet 0    Sig: TAKE 1 TABLET(40 MG) BY MOUTH DAILY     Cardiovascular:  Angiotensin Receptor Blockers Failed - 02/25/2021  8:15 PM      Failed - Cr in normal range and within 180 days    Creat  Date Value Ref Range Status  03/11/2020 0.79 0.50 - 1.05 mg/dL Final    Comment:    For patients >64 years of age, the reference limit for Creatinine is approximately 13% higher for people identified as African-American. .    Creatinine, Ser  Date Value Ref Range Status  03/20/2020 0.71 0.44 - 1.00 mg/dL Final         Failed - K in normal range and within 180 days    Potassium  Date Value Ref Range Status  03/20/2020 3.1 (L) 3.5 - 5.1 mmol/L Final         Passed - Patient is not pregnant      Passed - Last BP in normal range    BP Readings from Last 1 Encounters:  12/16/20 136/82         Passed - Valid encounter within last 6 months    Recent Outpatient Visits          2 months ago Fibromyalgia syndrome   [facility] [place], Drue Stager, MD   11 months ago Bipolar 1 disorder, mixed, moderate [facility])   [facility] Steele Sizer, MD   1 year ago Chronic neck pain   [facility] Steele Sizer, MD   1 year ago Cervical radiculitis   [facility] [place], Drue Stager, MD   2 years ago Bipolar 1 disorder, mixed, moderate [facility])   [facility] Steele Sizer, MD      Future Appointments            In 4 weeks Steele Sizer, MD [facility], [facility]
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
## 2021-03-25 ENCOUNTER — Ambulatory Visit (INDEPENDENT_AMBULATORY_CARE_PROVIDER_SITE_OTHER): Payer: BC Managed Care – PPO | Admitting: Family Medicine

## 2021-03-25 ENCOUNTER — Other Ambulatory Visit: Payer: Self-pay

## 2021-03-25 ENCOUNTER — Encounter: Payer: Self-pay | Admitting: Family Medicine

## 2021-03-25 VITALS — BP 136/84 | HR 91 | Temp 97.8°F | Resp 16 | Ht 64.0 in | Wt 148.0 lb

## 2021-03-25 DIAGNOSIS — Z0000 Encounter for general adult medical examination without abnormal findings: Secondary | ICD-10-CM

## 2021-03-25 DIAGNOSIS — Z1231 Encounter for screening mammogram for malignant neoplasm of breast: Secondary | ICD-10-CM | POA: Diagnosis not present

## 2021-03-25 DIAGNOSIS — Z1211 Encounter for screening for malignant neoplasm of colon: Secondary | ICD-10-CM

## 2021-03-25 DIAGNOSIS — Z23 Encounter for immunization: Secondary | ICD-10-CM

## 2021-03-25 DIAGNOSIS — F1021 Alcohol dependence, in remission: Secondary | ICD-10-CM

## 2021-03-25 DIAGNOSIS — Z122 Encounter for screening for malignant neoplasm of respiratory organs: Secondary | ICD-10-CM

## 2021-03-25 DIAGNOSIS — Z79899 Other long term (current) drug therapy: Secondary | ICD-10-CM

## 2021-03-25 DIAGNOSIS — E785 Hyperlipidemia, unspecified: Secondary | ICD-10-CM

## 2021-03-26 LAB — CBC WITH DIFFERENTIAL/PLATELET
Absolute Monocytes: 888 cells/uL (ref 200–950)
Basophils Absolute: 44 cells/uL (ref 0–200)
Basophils Relative: 0.4 %
Eosinophils Absolute: 22 cells/uL (ref 15–500)
Eosinophils Relative: 0.2 %
HCT: 41.2 % (ref 35.0–45.0)
Hemoglobin: 13.8 g/dL (ref 11.7–15.5)
Lymphs Abs: 3053 cells/uL (ref 850–3900)
MCH: 30.5 pg (ref 27.0–33.0)
MCHC: 33.5 g/dL (ref 32.0–36.0)
MCV: 91.2 fL (ref 80.0–100.0)
MPV: 12 fL (ref 7.5–12.5)
Monocytes Relative: 8 %
Neutro Abs: 7093 cells/uL (ref 1500–7800)
Neutrophils Relative %: 63.9 %
Platelets: 223 10*3/uL (ref 140–400)
RBC: 4.52 10*6/uL (ref 3.80–5.10)
RDW: 13 % (ref 11.0–15.0)
Total Lymphocyte: 27.5 %
WBC: 11.1 10*3/uL — ABNORMAL HIGH (ref 3.8–10.8)

## 2021-03-26 LAB — COMPLETE METABOLIC PANEL WITH GFR
AG Ratio: 1.8 (calc) (ref 1.0–2.5)
ALT: 9 U/L (ref 6–29)
AST: 19 U/L (ref 10–35)
Albumin: 4.7 g/dL (ref 3.6–5.1)
Alkaline phosphatase (APISO): 78 U/L (ref 37–153)
BUN: 14 mg/dL (ref 7–25)
CO2: 33 mmol/L — ABNORMAL HIGH (ref 20–32)
Calcium: 9.7 mg/dL (ref 8.6–10.4)
Chloride: 101 mmol/L (ref 98–110)
Creat: 0.75 mg/dL (ref 0.50–1.03)
Globulin: 2.6 g/dL (calc) (ref 1.9–3.7)
Glucose, Bld: 92 mg/dL (ref 65–99)
Potassium: 3.5 mmol/L (ref 3.5–5.3)
Sodium: 143 mmol/L (ref 135–146)
Total Bilirubin: 0.3 mg/dL (ref 0.2–1.2)
Total Protein: 7.3 g/dL (ref 6.1–8.1)
eGFR: 93 mL/min/{1.73_m2} (ref >=60)

## 2021-03-26 LAB — LIPID PANEL
Cholesterol: 149 mg/dL (ref <200)
HDL: 60 mg/dL (ref >=50)
LDL Cholesterol (Calc): 72 mg/dL (calc)
Non-HDL Cholesterol (Calc): 89 mg/dL (calc) (ref <130)
Total CHOL/HDL Ratio: 2.5 (calc) (ref <5.0)
Triglycerides: 87 mg/dL (ref <150)

## 2021-04-08 ENCOUNTER — Other Ambulatory Visit: Payer: Self-pay | Admitting: Family Medicine

## 2021-04-08 DIAGNOSIS — I10 Essential (primary) hypertension: Secondary | ICD-10-CM

## 2021-04-21 ENCOUNTER — Other Ambulatory Visit: Payer: Self-pay

## 2021-04-21 DIAGNOSIS — Z1211 Encounter for screening for malignant neoplasm of colon: Secondary | ICD-10-CM

## 2021-04-21 DIAGNOSIS — R195 Other fecal abnormalities: Secondary | ICD-10-CM

## 2021-04-21 LAB — COLOGUARD: COLOGUARD: POSITIVE — AB

## 2021-05-07 ENCOUNTER — Telehealth: Payer: Self-pay

## 2021-05-07 NOTE — Telephone Encounter (Signed)
Called patient no answer no way to leave message

## 2021-06-16 ENCOUNTER — Other Ambulatory Visit: Payer: Self-pay | Admitting: Family Medicine

## 2021-06-16 DIAGNOSIS — K219 Gastro-esophageal reflux disease without esophagitis: Secondary | ICD-10-CM

## 2021-06-23 IMAGING — CT CT ABD-PELV W/ CM
2 of 5 series · 16 of 46 positions shown, 18 images · IV contrast (APPLIED)
Comparison: CT Abdomen and Pelvis 01/07/2010.

CLINICAL DATA: 54-year-old female with abdominal pain. Right side
pain and diarrhea. Leukocytosis.

EXAM:
CT ABDOMEN AND PELVIS WITH CONTRAST
TECHNIQUE: Multidetector CT imaging of the abdomen and pelvis was performed
using the standard protocol following bolus administration of
intravenous contrast.
CONTRAST:  100mL OMNIPAQUE IOHEXOL 300 MG/ML  SOLN

[Series 2: routine abd/pel with · axial · 0.76mm/px · z∈[-868,-468]mm · 13 of 92 slices shown, 15 images]
[im 6/92  soft-tissue]
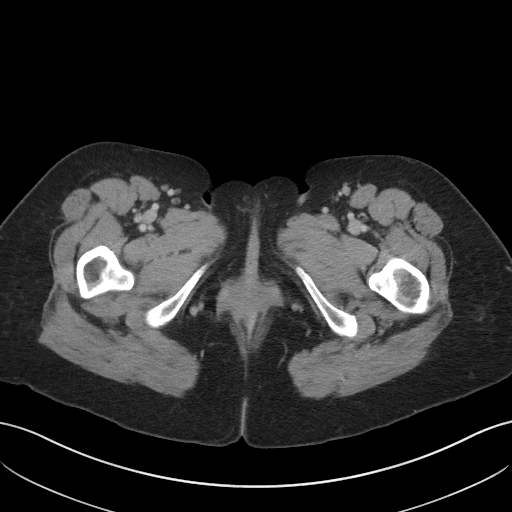
[im 6/92  bone]
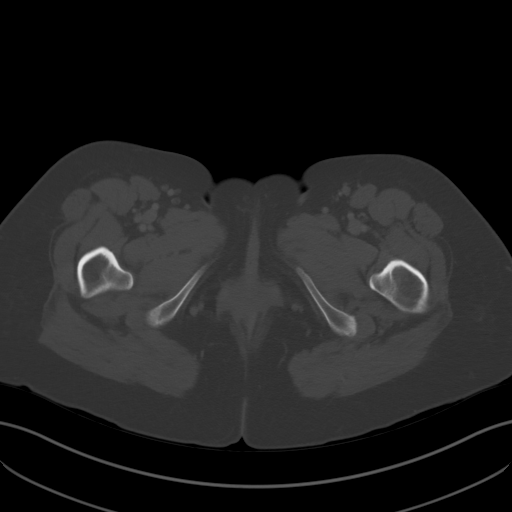
[im 11/92  soft-tissue]
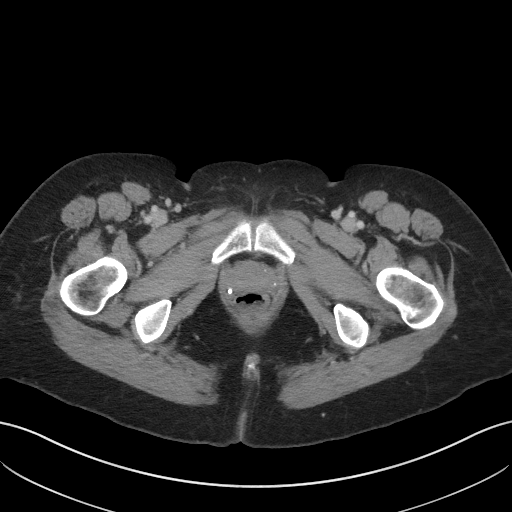
[im 21/92  soft-tissue]
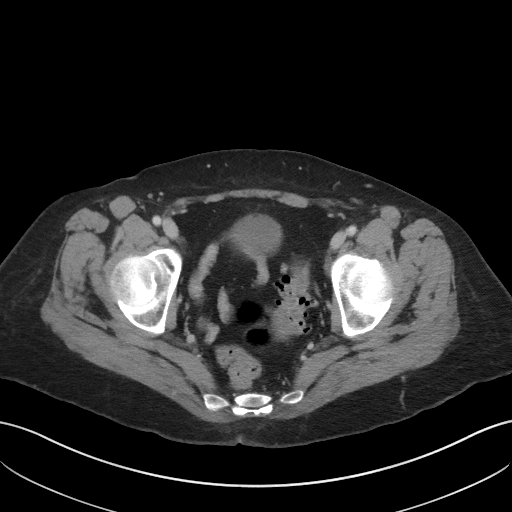
[im 26/92  soft-tissue]
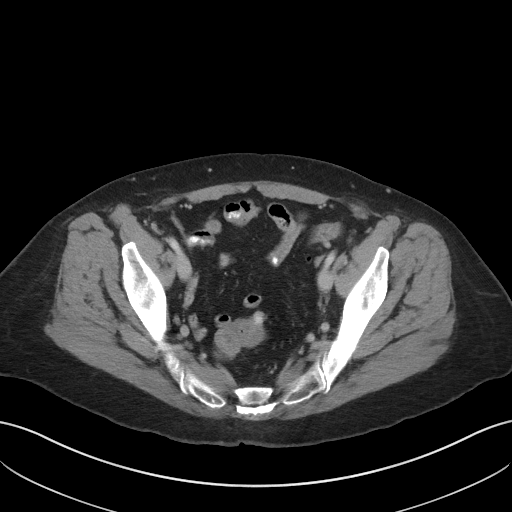
[im 31/92  soft-tissue]
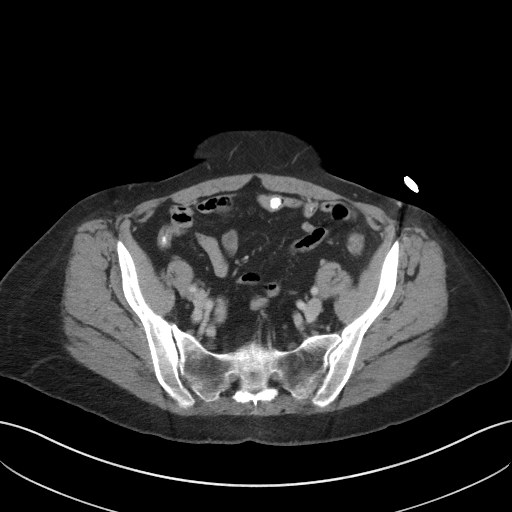
[im 41/92  soft-tissue]
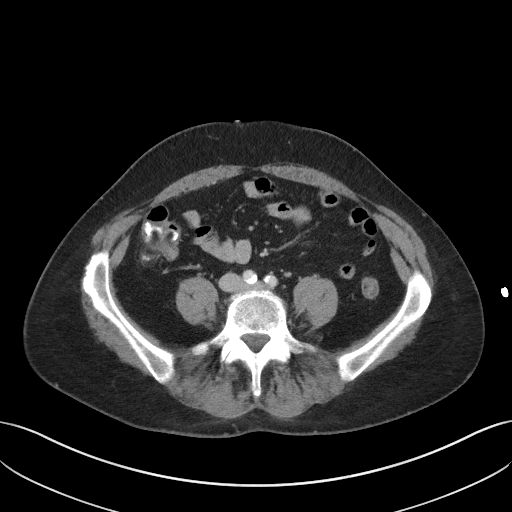
[im 46/92  soft-tissue]
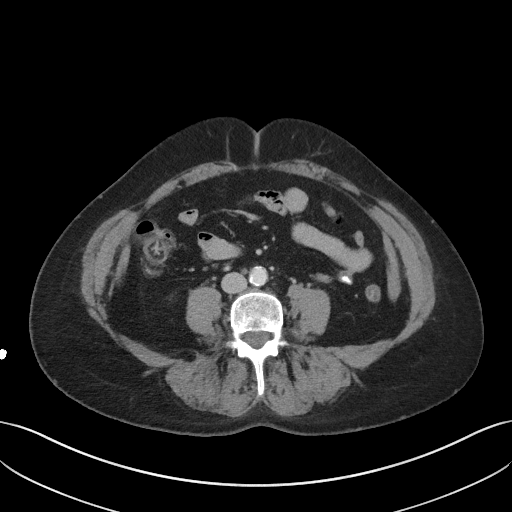
[im 51/92  soft-tissue]
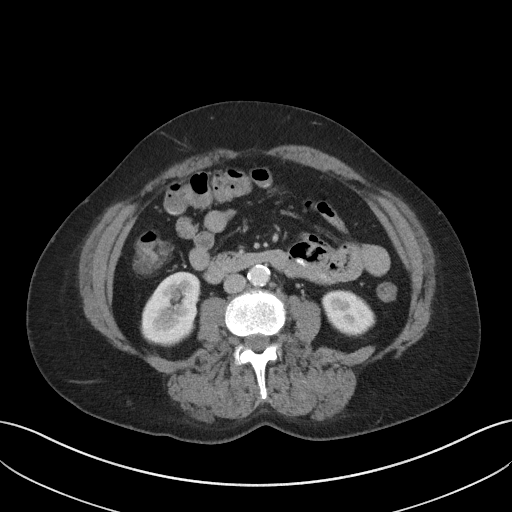
[im 61/92  soft-tissue]
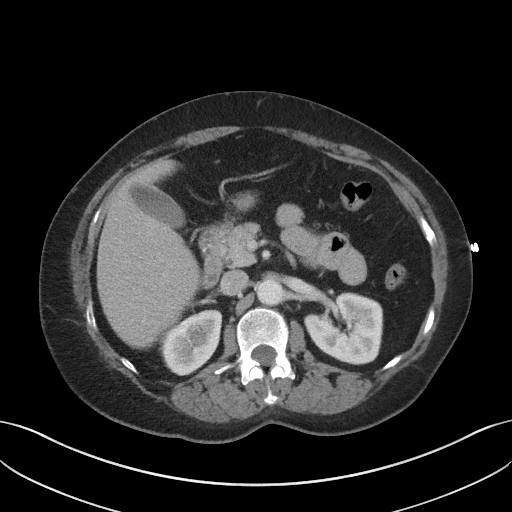
[im 61/92  bone]
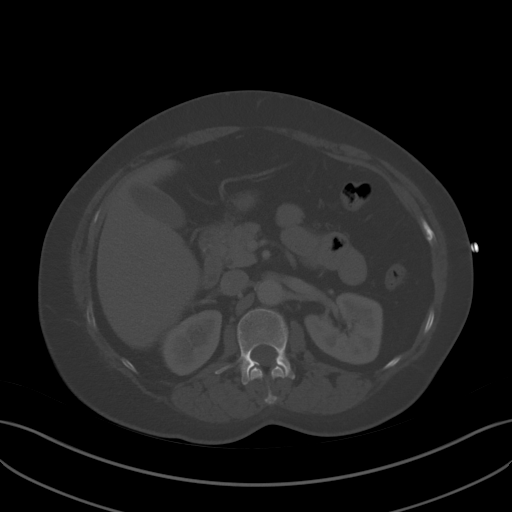
[im 66/92  soft-tissue]
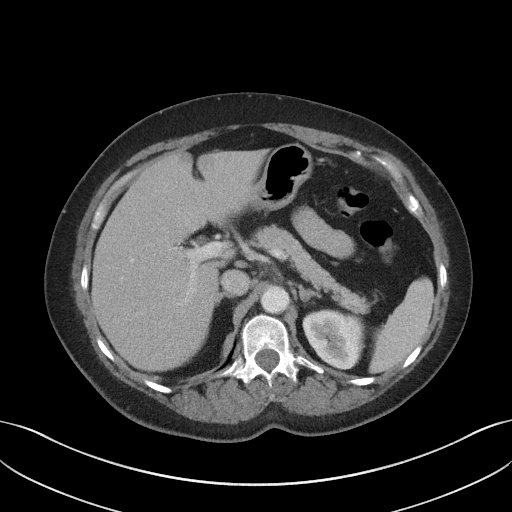
[im 71/92  soft-tissue]
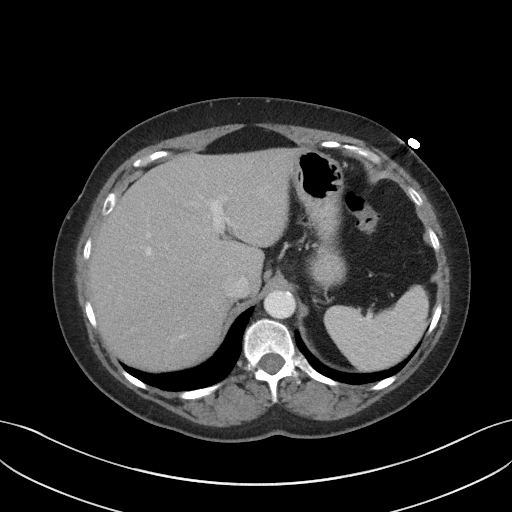
[im 81/92  soft-tissue]
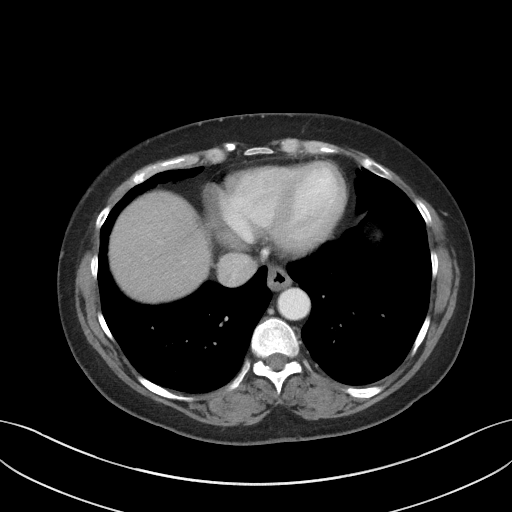
[im 86/92  soft-tissue]
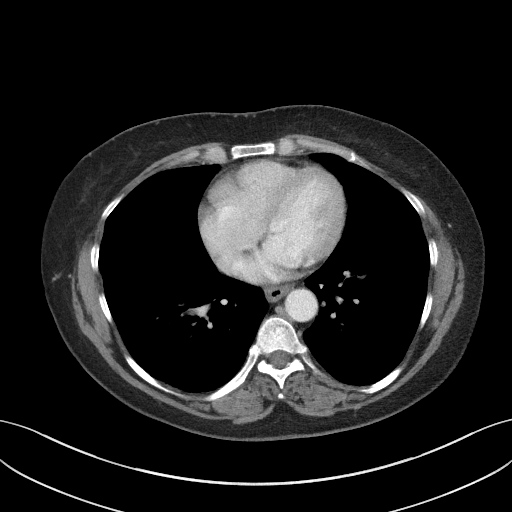

[Series 5: coronal st · coronal · 0.78mm/px · 3 of 91 slices shown]
[im 31/91  soft-tissue]
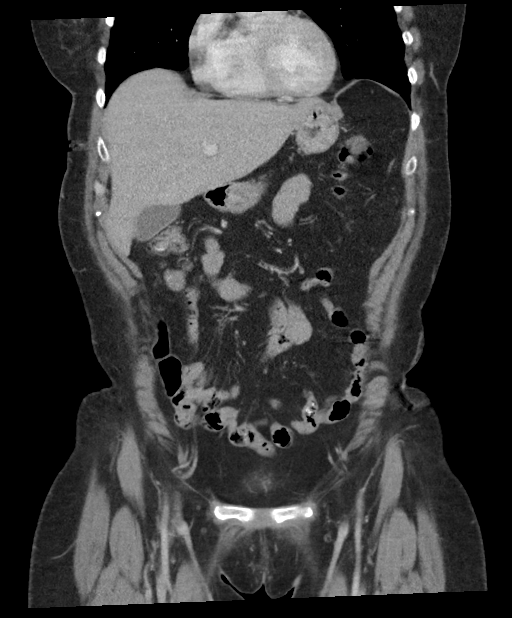
[im 41/91  soft-tissue]
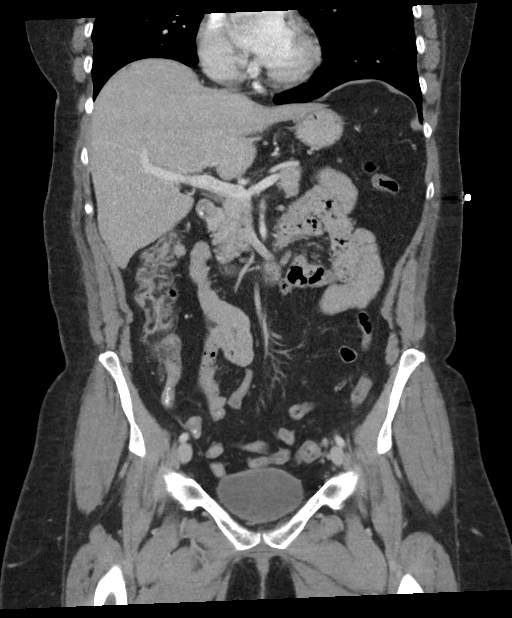
[im 51/91  soft-tissue]
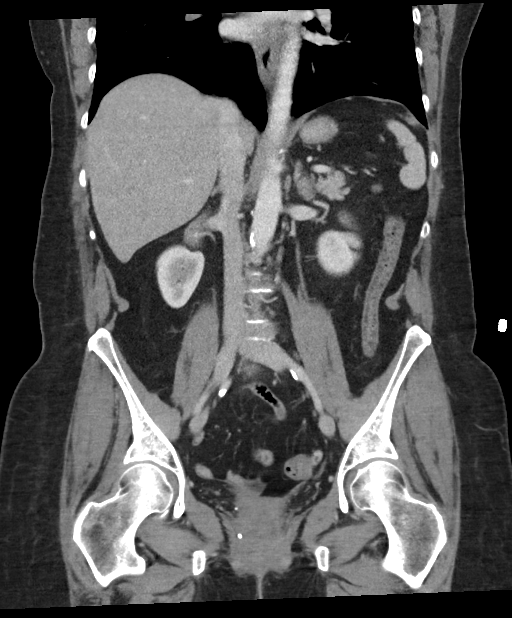

[16 of 46 positions shown; findings below may reference images not displayed]

FINDINGS: Lower chest: A small left lower lobe lung nodule on series 4, image
8 was present in [id number] and is stable, benign. Patchy additional
nonspecific subpleural opacity at the lung bases, most pronounced at
the lingula (series 4, image 11). No pleural effusion. No
pericardial or pleural effusion.

Hepatobiliary: Negative liver and gallbladder. No bile duct
enlargement.

Pancreas: Negative.

Spleen: Negative. Several small splenules are stable and normal
variation.

Adrenals/Urinary Tract: Normal adrenal glands. Bilateral renal
enhancement and contrast excretion is symmetric and within normal
limits. No nephrolithiasis. Decompressed and normal ureters.
Diminutive and unremarkable urinary bladder. Incidental pelvic
phleboliths.

Stomach/Bowel: Sigmoid diverticulosis. Decompressed descending and
sigmoid colon with no definite active inflammation. Redundant
transverse colon. Oral contrast has reached the hepatic flexure. No
convincing large bowel inflammation. Decompressed terminal ileum.
Appendix not identified and could be diminutive or absent. No
dilated small bowel. Decompressed stomach. No free air, free fluid,
or focal mesenteric stranding.

Vascular/Lymphatic: Aortoiliac calcified atherosclerosis. Major
arterial structures are patent. Portal venous system is patent. No
lymphadenopathy.

Reproductive: Surgically absent uterus. Diminutive or absent
ovaries.

Other: No pelvic free fluid.

Musculoskeletal: Chronic left posterior 8th and 9th rib fractures.
Advanced disc degeneration at the lumbosacral junction with vacuum
disc. No acute osseous abnormality identified.
IMPRESSION: 1. No acute or inflammatory process identified in the abdomen or
pelvis.
2. Mild patchy opacity at the lung bases is nonspecific and could be
mild atelectasis or scarring, but early respiratory infection is
difficult to exclude. No pleural effusion.
3. Sigmoid diverticulosis.   Aortic Atherosclerosis ([id number]).
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
## 2021-06-25 ENCOUNTER — Encounter: Payer: Self-pay | Admitting: Family Medicine

## 2021-06-25 ENCOUNTER — Ambulatory Visit: Payer: BC Managed Care – PPO | Admitting: Family Medicine

## 2021-06-25 DIAGNOSIS — G43109 Migraine with aura, not intractable, without status migrainosus: Secondary | ICD-10-CM

## 2021-06-25 DIAGNOSIS — M5441 Lumbago with sciatica, right side: Secondary | ICD-10-CM

## 2021-06-25 DIAGNOSIS — I10 Essential (primary) hypertension: Secondary | ICD-10-CM | POA: Diagnosis not present

## 2021-06-25 DIAGNOSIS — K219 Gastro-esophageal reflux disease without esophagitis: Secondary | ICD-10-CM | POA: Diagnosis not present

## 2021-06-25 DIAGNOSIS — G8929 Other chronic pain: Secondary | ICD-10-CM

## 2021-06-25 DIAGNOSIS — M542 Cervicalgia: Secondary | ICD-10-CM

## 2021-06-25 DIAGNOSIS — E785 Hyperlipidemia, unspecified: Secondary | ICD-10-CM

## 2021-06-25 DIAGNOSIS — M5431 Sciatica, right side: Secondary | ICD-10-CM

## 2021-06-25 MED ORDER — ROSUVASTATIN CALCIUM 20 MG PO TABS: ORAL_TABLET | ORAL | 0 refills | Status: DC

## 2021-06-25 MED ORDER — OMEPRAZOLE 40 MG PO CPDR: DELAYED_RELEASE_CAPSULE | ORAL | 0 refills | Status: DC

## 2021-06-25 MED ORDER — AMLODIPINE BESYLATE 2.5 MG PO TABS: ORAL_TABLET | ORAL | 1 refills | Status: DC

## 2021-06-25 MED ORDER — TRAMADOL HCL 50 MG PO TABS: 50.0000 mg | ORAL_TABLET | Freq: Every evening | ORAL | 1 refills | Status: DC

## 2021-06-25 MED ORDER — OLMESARTAN MEDOXOMIL 40 MG PO TABS: ORAL_TABLET | ORAL | 1 refills | Status: DC

## 2021-06-25 MED ORDER — METAXALONE 800 MG PO TABS: 800.0000 mg | ORAL_TABLET | Freq: Three times a day (TID) | ORAL | 1 refills | Status: DC

## 2021-06-26 ENCOUNTER — Telehealth: Payer: Self-pay

## 2021-06-26 NOTE — Telephone Encounter (Signed)
Completed PA for tramadol, approved for 6 months. PA: [id number]

## 2021-09-17 ENCOUNTER — Other Ambulatory Visit: Payer: Self-pay | Admitting: Family Medicine

## 2021-09-17 DIAGNOSIS — Z1231 Encounter for screening mammogram for malignant neoplasm of breast: Secondary | ICD-10-CM

## 2021-11-07 ENCOUNTER — Other Ambulatory Visit: Payer: Self-pay

## 2021-11-07 DIAGNOSIS — I10 Essential (primary) hypertension: Secondary | ICD-10-CM

## 2021-11-07 DIAGNOSIS — E785 Hyperlipidemia, unspecified: Secondary | ICD-10-CM

## 2021-11-07 DIAGNOSIS — K219 Gastro-esophageal reflux disease without esophagitis: Secondary | ICD-10-CM

## 2021-11-07 MED ORDER — AMLODIPINE BESYLATE 2.5 MG PO TABS: ORAL_TABLET | ORAL | 0 refills | Status: DC

## 2021-11-07 MED ORDER — OLMESARTAN MEDOXOMIL 40 MG PO TABS: ORAL_TABLET | ORAL | 0 refills | Status: DC

## 2021-11-07 MED ORDER — ROSUVASTATIN CALCIUM 20 MG PO TABS: ORAL_TABLET | ORAL | 0 refills | Status: DC

## 2021-11-07 MED ORDER — OMEPRAZOLE 40 MG PO CPDR: DELAYED_RELEASE_CAPSULE | ORAL | 0 refills | Status: DC

## 2021-11-12 ENCOUNTER — Telehealth: Payer: Self-pay

## 2021-11-12 ENCOUNTER — Other Ambulatory Visit: Payer: Self-pay

## 2021-11-12 DIAGNOSIS — E785 Hyperlipidemia, unspecified: Secondary | ICD-10-CM

## 2021-11-12 NOTE — Telephone Encounter (Signed)
Pt called back in to follow up on paperwork. I spoke with Katharine Look and she will continue to try to fax. Says if unable to get fax to go through she will get with office manager in the am to send via email.  Advise pt, patient expressed understanding. Pt says she would like to come and pick up form tomorrow.

## 2021-11-12 NOTE — Telephone Encounter (Signed)
Contacted [facility] case manager Jerl Santos in regards to faxing medication list on behalf of the patient. Faxed trials for 3 numbers provided failed throughout the day with multiple calls back to her letting her know of the failures. She stated she would have an IT ticket put in but could not tell me when the issue would be resolved. Will continue to try to get it to go through as this is a time sensitive matter for the patient.

## 2021-11-13 NOTE — Telephone Encounter (Signed)
Medication list has been emailed to case manager Jerl Santos as requested.  Email was sent on 11/13/2021 @ 11:59 AM.

## 2021-11-13 NOTE — Telephone Encounter (Signed)
Fax attempts failed throughout the morning as the entirety of attempts yesterday x3 given fax numbers. I re-contacted [facility] [employer] [facility] and they stated they are unsure when their fax system will up fixed as they found out there is an IT issue. I was given Nancy Lucero's email address: [email or web address] in case we can get the medication list uploaded/scanned and sent electronically. I am forwarding this along to our office manager in hopes we can come to a resolve for the patient.
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
## 2021-12-22 ENCOUNTER — Ambulatory Visit: Payer: BC Managed Care – PPO | Admitting: Family Medicine

## 2022-03-10 ENCOUNTER — Ambulatory Visit: Payer: Self-pay | Admitting: Nurse Practitioner
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
## 2022-04-22 ENCOUNTER — Ambulatory Visit: Payer: 59 | Admitting: Family Medicine

## 2022-04-22 ENCOUNTER — Encounter: Payer: Self-pay | Admitting: Family Medicine

## 2022-04-22 VITALS — BP 122/82 | HR 92 | Resp 16 | Ht 64.0 in | Wt 144.0 lb

## 2022-04-22 DIAGNOSIS — Z23 Encounter for immunization: Secondary | ICD-10-CM

## 2022-04-22 DIAGNOSIS — E785 Hyperlipidemia, unspecified: Secondary | ICD-10-CM

## 2022-04-22 DIAGNOSIS — M542 Cervicalgia: Secondary | ICD-10-CM | POA: Diagnosis not present

## 2022-04-22 DIAGNOSIS — I10 Essential (primary) hypertension: Secondary | ICD-10-CM

## 2022-04-22 DIAGNOSIS — R195 Other fecal abnormalities: Secondary | ICD-10-CM

## 2022-04-22 DIAGNOSIS — M5431 Sciatica, right side: Secondary | ICD-10-CM | POA: Diagnosis not present

## 2022-04-22 DIAGNOSIS — K219 Gastro-esophageal reflux disease without esophagitis: Secondary | ICD-10-CM

## 2022-04-22 DIAGNOSIS — G8929 Other chronic pain: Secondary | ICD-10-CM

## 2022-04-22 DIAGNOSIS — M5441 Lumbago with sciatica, right side: Secondary | ICD-10-CM

## 2022-04-22 DIAGNOSIS — G43109 Migraine with aura, not intractable, without status migrainosus: Secondary | ICD-10-CM

## 2022-04-22 DIAGNOSIS — Z9109 Other allergy status, other than to drugs and biological substances: Secondary | ICD-10-CM

## 2022-04-22 MED ORDER — TRAMADOL HCL 50 MG PO TABS: 50.0000 mg | ORAL_TABLET | Freq: Every evening | ORAL | 0 refills | Status: DC

## 2022-04-22 MED ORDER — UBRELVY 100 MG PO TABS: 1.0000 | ORAL_TABLET | Freq: Every day | ORAL | 1 refills | Status: AC | PRN

## 2022-04-22 MED ORDER — OLMESARTAN MEDOXOMIL 40 MG PO TABS: ORAL_TABLET | ORAL | 1 refills | Status: DC

## 2022-04-22 MED ORDER — ROSUVASTATIN CALCIUM 20 MG PO TABS: ORAL_TABLET | ORAL | 1 refills | Status: DC

## 2022-04-22 MED ORDER — METAXALONE 800 MG PO TABS: 800.0000 mg | ORAL_TABLET | Freq: Three times a day (TID) | ORAL | 1 refills | Status: DC

## 2022-04-22 MED ORDER — AMLODIPINE BESYLATE 2.5 MG PO TABS: ORAL_TABLET | ORAL | 1 refills | Status: DC

## 2022-04-22 MED ORDER — MONTELUKAST SODIUM 10 MG PO TABS: 10.0000 mg | ORAL_TABLET | Freq: Every day | ORAL | 1 refills | Status: DC

## 2022-04-22 MED ORDER — OMEPRAZOLE 40 MG PO CPDR: DELAYED_RELEASE_CAPSULE | ORAL | 1 refills | Status: DC

## 2022-04-22 NOTE — Patient Instructions (Signed)
[place]

## 2022-04-23 ENCOUNTER — Other Ambulatory Visit: Payer: Self-pay

## 2022-04-23 DIAGNOSIS — D649 Anemia, unspecified: Secondary | ICD-10-CM

## 2022-04-24 LAB — COMPLETE METABOLIC PANEL WITH GFR
AG Ratio: 2 (calc) (ref 1.0–2.5)
ALT: 8 U/L (ref 6–29)
AST: 12 U/L (ref 10–35)
Albumin: 4.3 g/dL (ref 3.6–5.1)
Alkaline phosphatase (APISO): 57 U/L (ref 37–153)
BUN: 20 mg/dL (ref 7–25)
CO2: 26 mmol/L (ref 20–32)
Calcium: 9.2 mg/dL (ref 8.6–10.4)
Chloride: 111 mmol/L — ABNORMAL HIGH (ref 98–110)
Creat: 0.66 mg/dL (ref 0.50–1.03)
Globulin: 2.2 g/dL (calc) (ref 1.9–3.7)
Glucose, Bld: 86 mg/dL (ref 65–99)
Potassium: 4.7 mmol/L (ref 3.5–5.3)
Sodium: 144 mmol/L (ref 135–146)
Total Bilirubin: 0.2 mg/dL (ref 0.2–1.2)
Total Protein: 6.5 g/dL (ref 6.1–8.1)
eGFR: 102 mL/min/{1.73_m2} (ref >=60)

## 2022-04-24 LAB — IRON,TIBC AND FERRITIN PANEL
%SAT: 10 % (calc) — ABNORMAL LOW (ref 16–45)
Ferritin: 10 ng/mL — ABNORMAL LOW (ref 16–232)
Iron: 39 ug/dL — ABNORMAL LOW (ref 45–160)
TIBC: 397 mcg/dL (calc) (ref 250–450)

## 2022-04-24 LAB — TEST AUTHORIZATION

## 2022-04-24 LAB — LIPID PANEL
Cholesterol: 179 mg/dL (ref <200)
HDL: 60 mg/dL (ref >=50)
LDL Cholesterol (Calc): 102 mg/dL (calc) — ABNORMAL HIGH
Non-HDL Cholesterol (Calc): 119 mg/dL (calc) (ref <130)
Total CHOL/HDL Ratio: 3 (calc) (ref <5.0)
Triglycerides: 80 mg/dL (ref <150)

## 2022-04-24 LAB — CBC WITH DIFFERENTIAL/PLATELET
Absolute Monocytes: 601 cells/uL (ref 200–950)
Basophils Absolute: 68 cells/uL (ref 0–200)
Basophils Relative: 0.7 %
Eosinophils Absolute: 369 cells/uL (ref 15–500)
Eosinophils Relative: 3.8 %
HCT: 35.4 % (ref 35.0–45.0)
Hemoglobin: 11.6 g/dL — ABNORMAL LOW (ref 11.7–15.5)
Lymphs Abs: 3628 cells/uL (ref 850–3900)
MCH: 31.7 pg (ref 27.0–33.0)
MCHC: 32.8 g/dL (ref 32.0–36.0)
MCV: 96.7 fL (ref 80.0–100.0)
MPV: 11.2 fL (ref 7.5–12.5)
Monocytes Relative: 6.2 %
Neutro Abs: 5034 cells/uL (ref 1500–7800)
Neutrophils Relative %: 51.9 %
Platelets: 253 10*3/uL (ref 140–400)
RBC: 3.66 10*6/uL — ABNORMAL LOW (ref 3.80–5.10)
RDW: 12 % (ref 11.0–15.0)
Total Lymphocyte: 37.4 %
WBC: 9.7 10*3/uL (ref 3.8–10.8)

## 2022-05-07 ENCOUNTER — Telehealth: Payer: Self-pay | Admitting: *Deleted

## 2022-05-07 ENCOUNTER — Other Ambulatory Visit: Payer: Self-pay | Admitting: *Deleted

## 2022-05-07 DIAGNOSIS — R195 Other fecal abnormalities: Secondary | ICD-10-CM

## 2022-05-07 MED ORDER — NA SULFATE-K SULFATE-MG SULF 17.5-3.13-1.6 GM/177ML PO SOLN: 1.0000 | Freq: Once | ORAL | 0 refills | Status: AC

## 2022-05-07 NOTE — Telephone Encounter (Signed)
Gastroenterology Pre-Procedure Review  Request Date: 06/02/2022 Requesting Physician: Dr. Vicente Males  PATIENT REVIEW QUESTIONS: The patient responded to the following health history questions as indicated:    1. Are you having any GI issues? yes (GERD, IBS) 2. Do you have a personal history of Polyps? yes (last colonoscopy was like 25-30 years ago with Dr Patrick Jupiter) 3. Do you have a family history of Colon Cancer or Polyps? no 4. Diabetes Mellitus? no 5. Joint replacements in the past 12 months?no 6. Major health problems in the past 3 months?no 7. Any artificial heart valves, MVP, or defibrillator?no    MEDICATIONS & ALLERGIES:    Patient reports the following regarding taking any anticoagulation/antiplatelet therapy:   Plavix, Coumadin, Eliquis, Xarelto, Lovenox, Pradaxa, Brilinta, or Effient? no Aspirin? yes (81 mg)  Patient confirms/reports the following medications:  Current Outpatient Medications  Medication Sig Dispense Refill   Na Sulfate-K Sulfate-Mg Sulf 17.5-3.13-1.6 GM/177ML SOLN Take 1 kit by mouth once for 1 dose. 354 mL 0   amLODipine (NORVASC) 2.5 MG tablet TAKE 1 TABLET(2.5 MG) BY MOUTH DAILY 90 tablet 1   b complex vitamins capsule Take by mouth.     clonazePAM (KLONOPIN) 1 MG tablet Take 1 mg by mouth 4 (four) times daily as needed.     FLUoxetine (PROZAC) 20 MG capsule Take 60 mg by mouth daily.     FLUoxetine (PROZAC) 20 MG tablet Take 60 mg by mouth daily.     metaxalone (SKELAXIN) 800 MG tablet Take 1 tablet (800 mg total) by mouth 3 (three) times daily. 180 tablet 1   montelukast (SINGULAIR) 10 MG tablet Take 1 tablet (10 mg total) by mouth at bedtime. 90 tablet 1   Nutritional Supplements (JUICE PLUS FIBRE PO) Take 1 capsule by mouth daily.     OLANZapine (ZYPREXA) 7.5 MG tablet Take 7.5 mg by mouth at bedtime.     olmesartan (BENICAR) 40 MG tablet TAKE 1 TABLET(40 MG) BY MOUTH DAILY 90 tablet 1   omeprazole (PRILOSEC) 40 MG capsule TAKE 1 CAPSULE(40 MG) BY MOUTH DAILY  90 capsule 1   Probiotic TBEC Take by mouth.     rosuvastatin (CRESTOR) 20 MG tablet TAKE 1 TABLET(20 MG) BY MOUTH DAILY 90 tablet 1   traMADol (ULTRAM) 50 MG tablet Take 1 tablet (50 mg total) by mouth every evening. Chronic back pain , takes it at night - do not take it with BZD due to risk of respiratory suppression 90 tablet 0   Ubrogepant (UBRELVY) 100 MG TABS Take 1 tablet by mouth daily as needed. 16 tablet 1   No current facility-administered medications for this visit.    Patient confirms/reports the following allergies:  Allergies  Allergen Reactions   Cephalosporins Anaphylaxis   Cortisone     blackouts   Cephalexin    Lyrica [Pregabalin]     Stutters with medication    Penicillins    Sulfa Antibiotics    Sulfacetamide Sodium    Fentanyl Nausea And Vomiting    blindness    No orders of the defined types were placed in this encounter.   AUTHORIZATION INFORMATION Primary Insurance: 1D#: Group #:  Secondary Insurance: 1D#: Group #:  SCHEDULE INFORMATION: Date: 06/02/2022 Time: Location: [place]

## 2022-06-02 ENCOUNTER — Ambulatory Visit
Admission: RE | Admit: 2022-06-02 | Discharge: 2022-06-02 | Disposition: A | Discharge status: Home / Self Care | Payer: 59 | Attending: Gastroenterology | Admitting: Gastroenterology

## 2022-06-02 ENCOUNTER — Encounter: Payer: Self-pay | Admitting: Gastroenterology

## 2022-06-02 ENCOUNTER — Ambulatory Visit: Payer: 59 | Admitting: Certified Registered"

## 2022-06-02 ENCOUNTER — Encounter
Admission: RE | Disposition: A | Discharge status: Home / Self Care | Payer: Self-pay | Source: Home / Self Care | Attending: Gastroenterology

## 2022-06-02 DIAGNOSIS — I10 Essential (primary) hypertension: Secondary | ICD-10-CM | POA: Diagnosis not present

## 2022-06-02 DIAGNOSIS — D126 Benign neoplasm of colon, unspecified: Secondary | ICD-10-CM | POA: Diagnosis not present

## 2022-06-02 DIAGNOSIS — Z79899 Other long term (current) drug therapy: Secondary | ICD-10-CM | POA: Insufficient documentation

## 2022-06-02 DIAGNOSIS — Z8249 Family history of ischemic heart disease and other diseases of the circulatory system: Secondary | ICD-10-CM | POA: Insufficient documentation

## 2022-06-02 DIAGNOSIS — Z1211 Encounter for screening for malignant neoplasm of colon: Secondary | ICD-10-CM | POA: Insufficient documentation

## 2022-06-02 DIAGNOSIS — R195 Other fecal abnormalities: Secondary | ICD-10-CM

## 2022-06-02 DIAGNOSIS — F319 Bipolar disorder, unspecified: Secondary | ICD-10-CM | POA: Insufficient documentation

## 2022-06-02 DIAGNOSIS — K5730 Diverticulosis of large intestine without perforation or abscess without bleeding: Secondary | ICD-10-CM | POA: Diagnosis not present

## 2022-06-02 DIAGNOSIS — M797 Fibromyalgia: Secondary | ICD-10-CM | POA: Diagnosis not present

## 2022-06-02 DIAGNOSIS — F17210 Nicotine dependence, cigarettes, uncomplicated: Secondary | ICD-10-CM | POA: Insufficient documentation

## 2022-06-02 DIAGNOSIS — D122 Benign neoplasm of ascending colon: Secondary | ICD-10-CM | POA: Insufficient documentation

## 2022-06-02 DIAGNOSIS — K219 Gastro-esophageal reflux disease without esophagitis: Secondary | ICD-10-CM | POA: Diagnosis not present

## 2022-06-02 HISTORY — PX: COLONOSCOPY WITH PROPOFOL: SHX5780

## 2022-06-02 SURGERY — COLONOSCOPY WITH PROPOFOL
Anesthesia: General

## 2022-06-02 MED ORDER — STERILE WATER FOR IRRIGATION IR SOLN
Status: DC | PRN
  Administered 2022-06-02: .06 mL

## 2022-06-02 MED ORDER — PROPOFOL 10 MG/ML IV BOLUS
INTRAVENOUS | Status: AC
  Filled 2022-06-02: qty 20

## 2022-06-02 MED ORDER — SODIUM CHLORIDE 0.9 % IV SOLN: INTRAVENOUS | Status: DC

## 2022-06-02 MED ORDER — LIDOCAINE HCL (CARDIAC) PF 100 MG/5ML IV SOSY
PREFILLED_SYRINGE | INTRAVENOUS | Status: DC | PRN
  Administered 2022-06-02: 100 mg via INTRAVENOUS

## 2022-06-02 MED ORDER — LIDOCAINE HCL (PF) 2 % IJ SOLN
INTRAMUSCULAR | Status: AC
  Filled 2022-06-02: qty 5

## 2022-06-02 MED ORDER — PROPOFOL 10 MG/ML IV BOLUS
INTRAVENOUS | Status: DC | PRN
  Administered 2022-06-02: 160 ug/kg/min via INTRAVENOUS
  Administered 2022-06-02: 100 mg via INTRAVENOUS

## 2022-06-02 MED ORDER — PHENYLEPHRINE 80 MCG/ML (10ML) SYRINGE FOR IV PUSH (FOR BLOOD PRESSURE SUPPORT)
PREFILLED_SYRINGE | INTRAVENOUS | Status: AC
  Filled 2022-06-02: qty 10

## 2022-06-02 MED ORDER — PHENYLEPHRINE HCL (PRESSORS) 10 MG/ML IV SOLN
INTRAVENOUS | Status: DC | PRN
  Administered 2022-06-02: 80 ug via INTRAVENOUS

## 2022-06-02 NOTE — Anesthesia Postprocedure Evaluation (Signed)
Anesthesia Post Note  Patient: Nancy Lucero  Procedure(s) Performed: COLONOSCOPY WITH PROPOFOL  Patient location during evaluation: Endoscopy Anesthesia Type: General Level of consciousness: awake and alert Pain management: pain level controlled Vital Signs Assessment: post-procedure vital signs reviewed and stable Respiratory status: spontaneous breathing, nonlabored ventilation, respiratory function stable and patient connected to nasal cannula oxygen Cardiovascular status: blood pressure returned to baseline and stable Postop Assessment: no apparent nausea or vomiting Anesthetic complications: no   No notable events documented.   Last Vitals:  Vitals:   06/02/22 0908 06/02/22 0918  BP: 122/86 123/83  Pulse: 74 78  Resp: 20 15  Temp:    SpO2: 99% 100%    Last Pain:  Vitals:   06/02/22 0918  TempSrc:   PainSc: 0-No pain                 Arita Miss

## 2022-06-02 NOTE — Anesthesia Preprocedure Evaluation (Signed)
Anesthesia Evaluation  Patient identified by MRN, date of birth, ID band Patient awake    Reviewed: Allergy & Precautions, NPO status , Patient's Chart, lab work & pertinent test results  History of Anesthesia Complications Negative for: history of anesthetic complications  Airway Mallampati: III  TM Distance: >3 FB Neck ROM: Full    Dental no notable dental hx. (+) Teeth Intact   Pulmonary neg sleep apnea, neg COPD, Current SmokerPatient did not abstain from smoking.   Pulmonary exam normal breath sounds clear to auscultation       Cardiovascular Exercise Tolerance: Good METShypertension, Pt. on medications (-) CAD and (-) Past MI (-) dysrhythmias  Rhythm:Regular Rate:Normal - Systolic murmurs    Neuro/Psych  Headaches PSYCHIATRIC DISORDERS Anxiety Depression Bipolar Disorder    Neuromuscular disease    GI/Hepatic ,GERD  ,,(+)     (-) substance abuse    Endo/Other  neg diabetes    Renal/GU negative Renal ROS     Musculoskeletal   Abdominal   Peds  Hematology   Anesthesia Other Findings Past Medical History: No date: Atrophic vaginitis No date: Backache No date: Bipolar affective ([place]) 10/20/2014: Bipolar disorder current episode depressed ([facility]) No date: Fibromyalgia No date: GERD (gastroesophageal reflux disease) No date: Hypoglycemia No date: Irritable bowel syndrome with diarrhea No date: Migraine without aura with status migrainosus  Reproductive/Obstetrics                              Anesthesia Physical Anesthesia Plan  ASA: 2  Anesthesia Plan: General   Post-op Pain Management: Minimal or no pain anticipated   Induction: Intravenous  PONV Risk Score and Plan: 2 and Propofol infusion, TIVA and Ondansetron  Airway Management Planned: Nasal Cannula  Additional Equipment: None  Intra-op Plan:   Post-operative Plan:   Informed Consent: I have reviewed the patients  History and Physical, chart, labs and discussed the procedure including the risks, benefits and alternatives for the proposed anesthesia with the patient or authorized representative who has indicated his/her understanding and acceptance.     Dental advisory given  Plan Discussed with: CRNA and Surgeon  Anesthesia Plan Comments: (Discussed risks of anesthesia with patient, including possibility of difficulty with spontaneous ventilation under anesthesia necessitating airway intervention, PONV, and rare risks such as cardiac or respiratory or neurological events, and allergic reactions. Discussed the role of CRNA in patient's perioperative care. Patient understands. Patient counseled on benefits of smoking cessation, and increased perioperative risks associated with continued smoking. )         Anesthesia Quick Evaluation

## 2022-06-02 NOTE — Transfer of Care (Signed)
Immediate Anesthesia Transfer of Care Note  Patient: Nancy Lucero  Procedure(s) Performed: COLONOSCOPY WITH PROPOFOL  Patient Location: PACU  Anesthesia Type:General  Level of Consciousness: awake  Airway & Oxygen Therapy: Patient Spontanous Breathing  Post-op Assessment: Report given to RN and Post -op Vital signs reviewed and stable  Post vital signs: Reviewed and stable  Last Vitals:  Vitals Value Taken Time  BP 99/69 06/02/22 0859  Temp 36.4 C 06/02/22 0858  Pulse 93 06/02/22 0859  Resp 18 06/02/22 0859  SpO2 100 % 06/02/22 0859  Vitals shown include unvalidated device data.  Last Pain:  Vitals:   06/02/22 0858  TempSrc: Temporal  PainSc: 0-No pain         Complications: No notable events documented.
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
## 2022-06-02 NOTE — Op Note (Signed)
[facility] Gastroenterology Patient Name: Nancy Lucero Procedure Date: 06/02/2022 8:22 AM MRN: [id number] Account #: [id number] Date of Birth: 10/16/1965 Admit Type: Outpatient Age: 57 Room: [facility] ENDO ROOM 2 Gender: Female Note Status: Finalized Instrument Name: Jasper Riling [id number] Procedure:             Colonoscopy Indications:           Positive Cologuard test Providers:             Jonathon Bellows MD, MD Referring MD:          Bethena Roys. Sowles, MD (Referring MD) Medicines:             Monitored Anesthesia Care Complications:         No immediate complications. Procedure:             Pre-Anesthesia Assessment:                        - Prior to the procedure, a History and Physical was                         performed, and patient medications, allergies and                         sensitivities were reviewed. The patient's tolerance                         of previous anesthesia was reviewed.                        - The risks and benefits of the procedure and the                         sedation options and risks were discussed with the                         patient. All questions were answered and informed                         consent was obtained.                        - ASA Grade Assessment: II - A patient with mild                         systemic disease.                        After obtaining informed consent, the colonoscope was                         passed under direct vision. Throughout the procedure,                         the patient's blood pressure, pulse, and oxygen                         saturations were monitored continuously. The                         Colonoscope was introduced through  the anus and                         advanced to the the cecum, identified by the                         appendiceal orifice. The colonoscopy was performed                         with ease. The patient tolerated the procedure well.                          The quality of the bowel preparation was excellent.                         The ileocecal valve, appendiceal orifice, and rectum                         were photographed. Findings:      The perianal and digital rectal examinations were normal.      An 8 mm polyp was found in the ascending colon. The polyp was sessile.       The polyp was removed with a cold snare. Resection and retrieval were       complete.      Multiple medium-mouthed diverticula were found in the sigmoid colon and       right colon.      The exam was otherwise without abnormality on direct and retroflexion       views. Impression:            - One 8 mm polyp in the ascending colon, removed with                         a cold snare. Resected and retrieved.                        - Diverticulosis in the sigmoid colon and in the right                         colon.                        - The examination was otherwise normal on direct and                         retroflexion views. Recommendation:        - Discharge patient to home (with escort).                        - Resume previous diet.                        - Continue present medications.                        - Await pathology results.                        - Repeat colonoscopy for surveillance and for  surveillance based on pathology results. Procedure Code(s):     --- Professional ---                        [phone], Colonoscopy, flexible; with removal of                         tumor(s), polyp(s), or other lesion(s) by snare                         technique Diagnosis Code(s):     --- Professional ---                        D12.2, Benign neoplasm of ascending colon                        R19.5, Other fecal abnormalities                        K57.30, Diverticulosis of large intestine without                         perforation or abscess without bleeding CPT copyright 2022 American Medical Association. All rights  reserved. The codes documented in this report are preliminary and upon coder review may  be revised to meet current compliance requirements. Jonathon Bellows, MD Jonathon Bellows MD, MD 06/02/2022 8:54:26 AM This report has been signed electronically. Number of Addenda: 0 Note Initiated On: 06/02/2022 8:22 AM Scope Withdrawal Time: 0 hours 6 minutes 13 seconds  Total Procedure Duration: 0 hours 11 minutes 3 seconds  Estimated Blood Loss:  Estimated blood loss: none.      [facility]

## 2022-06-03 ENCOUNTER — Encounter: Payer: Self-pay | Admitting: Gastroenterology

## 2022-06-03 LAB — SURGICAL PATHOLOGY
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
## 2022-07-21 ENCOUNTER — Encounter: Payer: Self-pay | Admitting: Family Medicine

## 2022-07-21 ENCOUNTER — Ambulatory Visit: Payer: 59 | Admitting: Family Medicine

## 2022-07-21 VITALS — BP 132/72 | HR 96 | Resp 16 | Ht 64.0 in | Wt 143.0 lb

## 2022-07-21 DIAGNOSIS — F1021 Alcohol dependence, in remission: Secondary | ICD-10-CM

## 2022-07-21 DIAGNOSIS — M5441 Lumbago with sciatica, right side: Secondary | ICD-10-CM | POA: Diagnosis not present

## 2022-07-21 DIAGNOSIS — G43109 Migraine with aura, not intractable, without status migrainosus: Secondary | ICD-10-CM | POA: Diagnosis not present

## 2022-07-21 DIAGNOSIS — M542 Cervicalgia: Secondary | ICD-10-CM

## 2022-07-21 DIAGNOSIS — Z9109 Other allergy status, other than to drugs and biological substances: Secondary | ICD-10-CM

## 2022-07-21 DIAGNOSIS — I10 Essential (primary) hypertension: Secondary | ICD-10-CM

## 2022-07-21 DIAGNOSIS — F3162 Bipolar disorder, current episode mixed, moderate: Secondary | ICD-10-CM

## 2022-07-21 DIAGNOSIS — G8929 Other chronic pain: Secondary | ICD-10-CM

## 2022-07-21 DIAGNOSIS — K219 Gastro-esophageal reflux disease without esophagitis: Secondary | ICD-10-CM

## 2022-07-21 DIAGNOSIS — E785 Hyperlipidemia, unspecified: Secondary | ICD-10-CM

## 2022-07-21 DIAGNOSIS — M797 Fibromyalgia: Secondary | ICD-10-CM

## 2022-07-21 MED ORDER — TRAMADOL HCL 50 MG PO TABS: 50.0000 mg | ORAL_TABLET | Freq: Every evening | ORAL | 0 refills | Status: DC

## 2022-09-25 ENCOUNTER — Ambulatory Visit: Payer: Self-pay | Admitting: *Deleted

## 2022-09-25 ENCOUNTER — Ambulatory Visit
Admission: RE | Admit: 2022-09-25 | Discharge: 2022-09-25 | Disposition: A | Discharge status: Home / Self Care | Payer: 59 | Source: Ambulatory Visit | Attending: Internal Medicine | Admitting: Internal Medicine

## 2022-09-25 ENCOUNTER — Ambulatory Visit
Admission: RE | Admit: 2022-09-25 | Discharge: 2022-09-25 | Disposition: A | Discharge status: Home / Self Care | Payer: 59 | Attending: Internal Medicine | Admitting: Internal Medicine

## 2022-09-25 ENCOUNTER — Ambulatory Visit (INDEPENDENT_AMBULATORY_CARE_PROVIDER_SITE_OTHER): Payer: 59 | Admitting: Internal Medicine

## 2022-09-25 VITALS — BP 132/80 | HR 120 | Temp 98.3°F | Resp 18 | Ht 64.0 in | Wt 134.8 lb

## 2022-09-25 DIAGNOSIS — R0602 Shortness of breath: Secondary | ICD-10-CM

## 2022-09-25 DIAGNOSIS — R55 Syncope and collapse: Secondary | ICD-10-CM | POA: Diagnosis not present

## 2022-09-25 DIAGNOSIS — R051 Acute cough: Secondary | ICD-10-CM | POA: Diagnosis not present

## 2022-09-25 DIAGNOSIS — R062 Wheezing: Secondary | ICD-10-CM | POA: Insufficient documentation

## 2022-09-25 MED ORDER — METHYLPREDNISOLONE 4 MG PO TBPK: ORAL_TABLET | ORAL | 0 refills | Status: DC

## 2022-09-25 MED ORDER — BENZONATATE 100 MG PO CAPS: 100.0000 mg | ORAL_CAPSULE | Freq: Two times a day (BID) | ORAL | 0 refills | Status: DC | PRN

## 2022-09-25 NOTE — Progress Notes (Signed)
Acute Office Visit  Subjective:     Patient ID: Nancy Lucero, female    DOB: [birth year], 57 y.o.   MRN: [id number]  Chief Complaint  Patient presents with   URI    Cough and SOB    Patient is in today for cough, shortness of breath and had an episode of passing out yesterday. Symptoms started 3 days ago. Symptoms started with coughing, wheezing. Nebulizer helped, cough syrup did not help. Also having a sharp headache and tinnitus bilaterally. Decreased hearing but no ear pain. Dull pain on left side face. Passed out yesterday a few times, once while driving. Losing consciousness for seconds. Did not eat for 48 hours during this time because she was busy at work. COVID test negative.  -Fever: no -Cough: yes, tan mucus  -Shortness of breath: yes with coughing or activity  -Wheezing: yes -Chest tightness: no -Chest congestion: yes -Nasal congestion: no -Runny nose: no -Sore throat: no -Headache: yes -Ear pain: no  -Ear pressure: yes   Review of Systems  Constitutional:  Positive for malaise/fatigue. Negative for chills and fever.  HENT:  Positive for tinnitus. Negative for congestion, ear pain and sore throat.   Respiratory:  Positive for cough, sputum production, shortness of breath and wheezing.   Neurological:  Positive for loss of consciousness.        Objective:    BP 132/80   Pulse (!) 120   Temp 98.3 F (36.8 C)   Resp 18   Ht 5\' 4"  (1.626 m)   Wt 134 lb 12.8 oz (61.1 kg)   SpO2 96%   BMI 23.14 kg/m  BP Readings from Last 3 Encounters:  09/25/22 132/80  07/21/22 132/72  06/02/22 123/83   Wt Readings from Last 3 Encounters:  09/25/22 134 lb 12.8 oz (61.1 kg)  07/21/22 143 lb (64.9 kg)  06/02/22 138 lb (62.6 kg)      Physical Exam Constitutional:      Appearance: Normal appearance.  HENT:     Head: Normocephalic and atraumatic.     Right Ear: Tympanic membrane, ear canal and external ear normal.     Left Ear: Tympanic membrane, ear canal and  external ear normal.     Mouth/Throat:     Mouth: Mucous membranes are moist.     Pharynx: Posterior oropharyngeal erythema present.  Eyes:     Conjunctiva/sclera: Conjunctivae normal.  Cardiovascular:     Rate and Rhythm: Normal rate and regular rhythm.  Pulmonary:     Effort: Pulmonary effort is normal.     Breath sounds: Wheezing present. No rales.  Skin:    General: Skin is warm and dry.  Neurological:     General: No focal deficit present.     Mental Status: She is alert. Mental status is at baseline.  Psychiatric:        Mood and Affect: Mood normal.        Behavior: Behavior normal.     No results found for any visits on 09/25/22.      Assessment & Plan:   1. Acute cough/SOB (shortness of breath)/Wheezing: Repeat COVID test here today, will obtain chest x-ray and labs. Treat with Medrol dose pack and cough suppressant for now. Patient given a work note, states she cannot take time off work this weekend.   - Novel Coronavirus, NAA ([facility]) - DG Chest 2 View; Future - CBC w/Diff/Platelet - COMPLETE METABOLIC PANEL WITH GFR - Procalcitonin - methylPREDNISolone (MEDROL DOSEPAK) 4 MG  TBPK tablet; Day 1: Take 8 mg (2 tablets) before breakfast, 4 mg (1 tablet) after lunch, 4 mg (1 tablet) after supper, and 8 mg (2 tablets) at bedtime. Day 2:Take 4 mg (1 tablet) before breakfast, 4 mg (1 tablet) after lunch, 4 mg (1 tablet) after supper, and 8 mg (2 tablets) at bedtime. Day 3: Take 4 mg (1 tablet) before breakfast, 4 mg (1 tablet) after lunch, 4 mg (1 tablet) after supper, and 4 mg (1 tablet) at bedtime. Day 4: Take 4 mg (1 tablet) before breakfast, 4 mg (1 tablet) after lunch, and 4 mg (1 tablet) at bedtime. Day 5: Take 4 mg (1 tablet) before breakfast and 4 mg (1 tablet) at bedtime. Day 6: Take 4 mg (1 tablet) before breakfast.  Dispense: 1 each; Refill: 0 - benzonatate (TESSALON) 100 MG capsule; Take 1 capsule (100 mg total) by mouth 2 (two) times daily as needed for cough.   Dispense: 20 capsule; Refill: 0  2. Syncope and collapse: States this occurred multiply times yesterday however she did not eat for 2 days. Will obtain labs but thinking this is due to hypoglycemia and current respiratory symptoms. Patient will follow up or go to the ER for evaluation if this occurs again.    Return if symptoms worsen or fail to improve.  Margarita Mail, DO

## 2022-09-25 NOTE — Telephone Encounter (Signed)
Reason for Disposition  [1] MILD difficulty breathing (e.g., minimal/no SOB at rest, SOB with walking, pulse <100) AND [2] NEW-onset or WORSE than normal  Answer Assessment - Initial Assessment Questions 1. RESPIRATORY STATUS: "Describe your breathing?" (e.g., wheezing, shortness of breath, unable to speak, severe coughing)      I'm having trouble breathing and coughing so much I'm short of breath.  It's tan color.   2. ONSET: "When did this breathing problem begin?"      I have asthma.    Tues. 3. PATTERN "Does the difficult breathing come and go, or has it been constant since it started?"      Constant since Tues. 4. SEVERITY: "How bad is your breathing?" (e.g., mild, moderate, severe)    - MILD: No SOB at rest, mild SOB with walking, speaks normally in sentences, can lie down, no retractions, pulse < 100.    - MODERATE: SOB at rest, SOB with minimal exertion and prefers to sit, cannot lie down flat, speaks in phrases, mild retractions, audible wheezing, pulse 100-120.    - SEVERE: Very SOB at rest, speaks in single words, struggling to breathe, sitting hunched forward, retractions, pulse > 120      The coughing is so bad.   Moderate 5. RECURRENpainT SYMPTOM: "Have you had difficulty breathing before?" If Yes, ask: "When was the last time?" and "What happened that time?"      Yes   I've had asthma. 6. CARDIAC HISTORY: "Do you have any history of heart disease?" (e.g., heart attack, angina, bypass surgery, angioplasty)      Not asked 7. LUNG HISTORY: "Do you have any history of lung disease?"  (e.g., pulmonary embolus, asthma, emphysema)     Asthma 8. CAUSE: "What do you think is causing the breathing problem?"      I have asthma 9. OTHER SYMPTOMS: "Do you have any other symptoms? (e.g., dizziness, runny nose, cough, chest pain, fever)     Sharp headaches in the back of my head.   No sinus congestion. 10. O2 SATURATION MONITOR:  "Do you use an oxygen saturation monitor (pulse oximeter) at  home?" If Yes, ask: "What is your reading (oxygen level) today?" "What is your usual oxygen saturation reading?" (e.g., 95%)       N/A 11. PREGNANCY: "Is there any chance you are pregnant?" "When was your last menstrual period?"       N/A 12. TRAVEL: "Have you traveled out of the country in the last month?" (e.g., travel history, exposures)       N/A  Protocols used: Breathing Difficulty-A-AH

## 2022-09-25 NOTE — Telephone Encounter (Signed)
  Chief Complaint: coughing a lot, shortness of breath, passing out Symptoms: Above  Also sharp pains in the back of her head. Frequency: Since Tues.    Feels like an asthma flare.    Had asthma as a child. Pertinent Negatives: Patient denies fever or sinus congestion. Disposition: [] ED /[] Urgent Care (no appt availability in office) / [x] Appointment(In office/virtual)/ []  [facility]/ [] Home Care/ [] Refused Recommended Disposition /[] [facility]/ []  Follow-up with PCP Additional Notes: Appt made for today with Dr. Caralee Ates at 11:00.  Dr. Carlynn Purl did not have any openings today.

## 2022-09-28 ENCOUNTER — Other Ambulatory Visit: Payer: Self-pay | Admitting: Family Medicine

## 2022-09-28 DIAGNOSIS — Z9109 Other allergy status, other than to drugs and biological substances: Secondary | ICD-10-CM

## 2022-09-28 DIAGNOSIS — E785 Hyperlipidemia, unspecified: Secondary | ICD-10-CM

## 2022-09-28 DIAGNOSIS — I10 Essential (primary) hypertension: Secondary | ICD-10-CM

## 2022-09-29 ENCOUNTER — Telehealth: Payer: 59 | Admitting: Physician Assistant

## 2022-09-29 ENCOUNTER — Encounter: Payer: Self-pay | Admitting: Internal Medicine

## 2022-09-29 ENCOUNTER — Ambulatory Visit (INDEPENDENT_AMBULATORY_CARE_PROVIDER_SITE_OTHER): Payer: 59

## 2022-09-29 ENCOUNTER — Ambulatory Visit: Payer: Self-pay

## 2022-09-29 ENCOUNTER — Ambulatory Visit
Admission: EM | Admit: 2022-09-29 | Discharge: 2022-09-29 | Disposition: A | Discharge status: Home / Self Care | Payer: 59 | Attending: Emergency Medicine | Admitting: Emergency Medicine

## 2022-09-29 DIAGNOSIS — J40 Bronchitis, not specified as acute or chronic: Secondary | ICD-10-CM | POA: Insufficient documentation

## 2022-09-29 DIAGNOSIS — R0600 Dyspnea, unspecified: Secondary | ICD-10-CM

## 2022-09-29 LAB — CBC WITH DIFFERENTIAL/PLATELET
Abs Immature Granulocytes: 0.16 10*3/uL — ABNORMAL HIGH (ref 0.00–0.07)
Basophils Absolute: 0.1 10*3/uL (ref 0.0–0.1)
Basophils Relative: 0 %
Eosinophils Absolute: 0 10*3/uL (ref 0.0–0.5)
Eosinophils Relative: 0 %
HCT: 31.5 % — ABNORMAL LOW (ref 36.0–46.0)
Hemoglobin: 10.3 g/dL — ABNORMAL LOW (ref 12.0–15.0)
Immature Granulocytes: 1 %
Lymphocytes Relative: 21 %
Lymphs Abs: 2.9 10*3/uL (ref 0.7–4.0)
MCH: 29.3 pg (ref 26.0–34.0)
MCHC: 32.7 g/dL (ref 30.0–36.0)
MCV: 89.7 fL (ref 80.0–100.0)
Monocytes Absolute: 0.9 10*3/uL (ref 0.1–1.0)
Monocytes Relative: 6 %
Neutro Abs: 9.5 10*3/uL — ABNORMAL HIGH (ref 1.7–7.7)
Neutrophils Relative %: 72 %
Platelets: 362 10*3/uL (ref 150–400)
RBC: 3.51 MIL/uL — ABNORMAL LOW (ref 3.87–5.11)
RDW: 14.9 % (ref 11.5–15.5)
WBC: 13.5 10*3/uL — ABNORMAL HIGH (ref 4.0–10.5)
nRBC: 0 % (ref 0.0–0.2)

## 2022-09-29 LAB — NOVEL CORONAVIRUS, NAA: SARS-CoV-2, NAA: NOT DETECTED

## 2022-09-29 LAB — SPECIMEN STATUS REPORT

## 2022-09-29 MED ORDER — AEROCHAMBER MV MISC: 2 refills | Status: AC

## 2022-09-29 MED ORDER — PROMETHAZINE-DM 6.25-15 MG/5ML PO SYRP: 5.0000 mL | ORAL_SOLUTION | Freq: Four times a day (QID) | ORAL | 0 refills | Status: DC | PRN

## 2022-09-29 MED ORDER — DOXYCYCLINE HYCLATE 100 MG PO TABS
100.0000 mg | ORAL_TABLET | Freq: Two times a day (BID) | ORAL | 0 refills | Status: AC
Start: 2022-09-29 — End: 2022-10-06

## 2022-09-29 MED ORDER — ALBUTEROL SULFATE HFA 108 (90 BASE) MCG/ACT IN AERS: 2.0000 | INHALATION_SPRAY | RESPIRATORY_TRACT | 0 refills | Status: AC | PRN

## 2022-09-29 NOTE — Telephone Encounter (Signed)
  Chief Complaint: cough Symptoms: wheezing, harsh and sever soughing episodes that pt passes out- pt can talk in full sentences SOB with walking, unable to take a full deep breath  Frequency: "months" Pertinent Negatives: Patient denies fever, Disposition: [] ED /[x] Urgent Care (no appt availability in office) / [] Appointment(In office/virtual)/ []  [facility]/ [] Home Care/ [] Refused Recommended Disposition /[] [facility]/ []  Follow-up with PCP Additional Notes: to UC in [place] address provided.  Reason for Disposition  [1] MILD difficulty breathing (e.g., minimal/no SOB at rest, SOB with walking, pulse <100) AND [2] NEW-onset or WORSE than normal  Answer Assessment - Initial Assessment Questions 1. RESPIRATORY STATUS: "Describe your breathing?" (e.g., wheezing, shortness of breath, unable to speak, severe coughing)      SOB  2. ONSET: "When did this breathing problem begin?"      months 3. PATTERN "Does the difficult breathing come and go, or has it been constant since it started?"      Comes and goes with coughing 4. SEVERITY: "How bad is your breathing?" (e.g., mild, moderate, severe)    - MILD: No SOB at rest, mild SOB with walking, speaks normally in sentences, can lie down, no retractions, pulse < 100.    - MODERATE: SOB at rest, SOB with minimal exertion and prefers to sit, cannot lie down flat, speaks in phrases, mild retractions, audible wheezing, pulse 100-120.    - SEVERE: Very SOB at rest, speaks in single words, struggling to breathe, sitting hunched forward, retractions, pulse > 120      moderate 5. RECURRENT SYMPTOM: "Have you had difficulty breathing before?" If Yes, ask: "When was the last time?" and "What happened that time?"       6. CARDIAC HISTORY: "Do you have any history of heart disease?" (e.g., heart attack, angina, bypass surgery, angioplasty)      MVP 7. LUNG HISTORY: "Do you have any history of lung disease?"  (e.g., pulmonary embolus,  asthma, emphysema)     N/a 8. CAUSE: "What do you think is causing the breathing problem?"      Infection or fluid build up 9. OTHER SYMPTOMS: "Do you have any other symptoms? (e.g., dizziness, runny nose, cough, chest pain, fever)     Syncopal episodes- fatigue, foamy phlegm, wheezing, harsh cough  10. O2 SATURATION MONITOR:  "Do you use an oxygen saturation monitor (pulse oximeter) at home?" If Yes, ask: "What is your reading (oxygen level) today?" "What is your usual oxygen saturation reading?" (e.g., 95%)       98 11. PREGNANCY: "Is there any chance you are pregnant?" "When was your last menstrual period?"       N/a 12. TRAVEL: "Have you traveled out of the country in the last month?" (e.g., travel history, exposures)       N/a  Protocols used: Breathing Difficulty-A-AH

## 2022-09-29 NOTE — Discharge Instructions (Addendum)
Your chest x-ray today did not show any pneumonia.  Your exam is consistent with bronchitis however.  Stop the steroids as I do not feel they are helping you.  Pick up the doxycycline called in by your primary care provider and start taking it.  You will take it twice daily with food for 7 days.  I am going to prescribe you an albuterol inhaler.  I want you to use this with the spacer that I have prescribed.  You can have 2 puffs every 4-6 hours as needed for shortness of breath or wheezing.  This will also help with cough.  Continue the [employer] as previously prescribed for your cough during the day.  I am also can prescribe you Promethazine DM cough syrup.  You can use this every 6 hours as needed for cough and congestion.  If your symptoms continue, or they worsen, you need to go to the ER for evaluation.  In any event, you need to make a follow-up appointment with your PCP for next week to ensure that you are improving.

## 2022-09-29 NOTE — ED Provider Notes (Signed)
[facility]    CSN: [id number] Arrival date & time: 09/29/22  1423      History   Chief Complaint Chief Complaint  Patient presents with   Cough   Fatigue   Loss of Consciousness   Hallucinations    HPI Nancy Lucero is a 57 y.o. female.   HPI  57 year old female with a past medical history significant for bipolar disorder, GERD, hypoglycemia, IBS-D, migraine without aura, and anorexia presents for evaluation of 5 days worth of cough with dyspnea on exertion.  She states that her cough is productive for a frothy white sputum that is sometimes yellow in color.  The patient is a smoker.  She denies any fever.  She did see her PCP on 5/24 and had a chest x-ray and labs drawn at that time.  She was started on a Medrol Dosepak and [employer] which she reports has not helped her symptoms at all.  Prior to the 524 visit patient had several episodes of syncope but also reported to her PCP that she had not eaten for the 2 days prior.  Additionally, she reports that she has been having visual hallucinations to include seeing her dog outside when the dog was in the house and seeing shadows of other people.  She also reports that she has recall of events that others do not to include going to [employer] to buy a strawberry cream pie with her sister which her sister denies any knowledge of.  Patient also reports that she has been experiencing fatigue for months.  All of her current symptoms were prompted by an asthma attack.  Past Medical History:  Diagnosis Date   Atrophic vaginitis    Backache    Bipolar affective (HCC)    Bipolar disorder current episode depressed (HCC) 10/20/2014   Fibromyalgia    GERD (gastroesophageal reflux disease)    Hypoglycemia    Irritable bowel syndrome with diarrhea    Migraine without aura with status migrainosus     Patient Active Problem List   Diagnosis Date Noted   Positive colorectal cancer screening using Cologuard test 06/02/2022    Adenomatous polyp of colon 06/02/2022   Alcohol dependence in remission (HCC) 03/25/2021   Esophageal dysphagia 05/09/2020   History of diarrhea 05/09/2020   PTSD (post-traumatic stress disorder) 11/30/2018   Insomnia due to medical condition 11/30/2018   Torn meniscus 06/21/2018   Hypertension 09/01/2017   S/P cervical spinal fusion 12/01/2016   Pain management contract signed 12/05/2015   MVP (mitral valve prolapse) 08/28/2015   Dyslipidemia 08/28/2015   Anxiety 08/28/2015   Bipolar disorder (HCC) 08/28/2015   History of seizures 08/28/2015   Chronic back pain 10/20/2014   Cervical radiculitis 10/20/2014   Anorexia nervosa, restricting type 10/20/2014   Fibromyalgia syndrome 10/20/2014   Fibrocystic breast disease (FCBD) in female 10/20/2014   Gastro-esophageal reflux disease without esophagitis 10/20/2014   Irritable bowel syndrome (IBS) 10/20/2014   Migraine without aura and responsive to treatment 10/20/2014   Panic attacks 10/20/2014   Spondylosis 10/20/2014   Tobacco use 10/20/2014   Mixed urge and stress incontinence 10/20/2014   Atrophy of vagina 10/20/2014    Past Surgical History:  Procedure Laterality Date   ABDOMINAL HYSTERECTOMY     APPENDECTOMY     COLONOSCOPY WITH PROPOFOL N/A 06/02/2022   Procedure: COLONOSCOPY WITH PROPOFOL;  Surgeon: Wyline Mood, MD;  Location: [facility] ENDOSCOPY;  Service: Gastroenterology;  Laterality: N/A;   KNEE SURGERY Right 10/15/2015   [facility]-removed  meniscus   SPINAL FUSION     Neck 3/4/5   TONSILLECTOMY     TOOTH EXTRACTION  02/03/2016    OB History   No obstetric history on file.      Home Medications    Prior to Admission medications   Medication Sig Start Date End Date Taking? Authorizing Provider  albuterol (VENTOLIN HFA) 108 (90 Base) MCG/ACT inhaler Inhale 2 puffs into the lungs every 4 (four) hours as needed. 09/29/22  Yes Becky Augusta, NP  amLODipine (NORVASC) 2.5 MG tablet TAKE 1 TABLET DAILY 09/29/22  Yes Alba Cory, MD  b complex vitamins capsule Take by mouth.   Yes [provider]  benzonatate (TESSALON) 100 MG capsule Take 1 capsule (100 mg total) by mouth 2 (two) times daily as needed for cough. 09/25/22  Yes Margarita Mail, DO  clonazePAM (KLONOPIN) 1 MG tablet Take 1 mg by mouth 4 (four) times daily as needed. 08/19/19  Yes Milagros Evener, MD  doxycycline (VIBRA-TABS) 100 MG tablet Take 1 tablet (100 mg total) by mouth 2 (two) times daily for 7 days. 09/29/22 10/06/22 Yes Margarita Mail, DO  FLUoxetine (PROZAC) 20 MG capsule Take 60 mg by mouth daily. 10/18/21  Yes Milagros Evener, MD  metaxalone (SKELAXIN) 800 MG tablet Take 1 tablet (800 mg total) by mouth 3 (three) times daily. 04/22/22  Yes Sowles, Danna Hefty, MD  methylPREDNISolone (MEDROL DOSEPAK) 4 MG TBPK tablet Day 1: Take 8 mg (2 tablets) before breakfast, 4 mg (1 tablet) after lunch, 4 mg (1 tablet) after supper, and 8 mg (2 tablets) at bedtime. Day 2:Take 4 mg (1 tablet) before breakfast, 4 mg (1 tablet) after lunch, 4 mg (1 tablet) after supper, and 8 mg (2 tablets) at bedtime. Day 3: Take 4 mg (1 tablet) before breakfast, 4 mg (1 tablet) after lunch, 4 mg (1 tablet) after supper, and 4 mg (1 tablet) at bedtime. Day 4: Take 4 mg (1 tablet) before breakfast, 4 mg (1 tablet) after lunch, and 4 mg (1 tablet) at bedtime. Day 5: Take 4 mg (1 tablet) before breakfast and 4 mg (1 tablet) at bedtime. Day 6: Take 4 mg (1 tablet) before breakfast. 09/25/22  Yes Margarita Mail, DO  montelukast (SINGULAIR) 10 MG tablet TAKE 1 TABLET AT BEDTIME 09/29/22  Yes Sowles, Danna Hefty, MD  OLANZapine (ZYPREXA) 7.5 MG tablet Take 7.5 mg by mouth at bedtime. 08/31/19  Yes Milagros Evener, MD  olmesartan (BENICAR) 40 MG tablet TAKE 1 TABLET DAILY 09/29/22  Yes Sowles, Danna Hefty, MD  omeprazole (PRILOSEC) 40 MG capsule TAKE 1 CAPSULE(40 MG) BY MOUTH DAILY 04/22/22  Yes Sowles, Danna Hefty, MD  Probiotic TBEC Take by mouth. 06/27/14  Yes [provider]   promethazine-dextromethorphan (PROMETHAZINE-DM) 6.25-15 MG/5ML syrup Take 5 mLs by mouth 4 (four) times daily as needed. 09/29/22  Yes Becky Augusta, NP  rosuvastatin (CRESTOR) 20 MG tablet TAKE 1 TABLET DAILY 09/29/22  Yes Alba Cory, MD  Spacer/Aero-Holding Chambers (AEROCHAMBER MV) inhaler Use as instructed 09/29/22  Yes Becky Augusta, NP  traMADol (ULTRAM) 50 MG tablet Take 1 tablet (50 mg total) by mouth every evening. Chronic back pain , takes it at night - do not take it with BZD due to risk of respiratory suppression 07/21/22  Yes Sowles, Danna Hefty, MD  Ubrogepant (UBRELVY) 100 MG TABS Take 1 tablet by mouth daily as needed. 04/22/22  Yes Alba Cory, MD    Family History Family History  Problem Relation Age of Onset   Mental illness Mother  Heart disease Mother    Depression Son    Mental illness Son    Mental illness Maternal Aunt    Depression Son    Mental illness Son    Depression Son    Mental illness Son    Congestive Heart Failure Father    Atrial fibrillation Father    Heart disease Father    Mental illness Sister     Social History Social History   Tobacco Use   Smoking status: Every Day    Years: 40    Types: E-cigarettes, Cigarettes    Start date: 05/04/1977   Smokeless tobacco: Never   Tobacco comments:    smoking a couple daily again, she had quit   Vaping Use   Vaping Use: Former  Substance Use Topics   Alcohol use: Not Currently   Drug use: No     Allergies   Cephalosporins, Cortisone, Cephalexin, Lyrica [pregabalin], Penicillins, Sulfa antibiotics, Sulfacetamide sodium, and Fentanyl   Review of Systems Review of Systems  Constitutional:  Positive for fatigue. Negative for fever.  Respiratory:  Positive for cough, shortness of breath and wheezing.   Cardiovascular:  Negative for chest pain.  Psychiatric/Behavioral:  Positive for hallucinations.      Physical Exam Triage Vital Signs ED Triage Vitals  Enc Vitals Group     BP       Pulse      Resp      Temp      Temp src      SpO2      Weight      Height      Head Circumference      Peak Flow      Pain Score      Pain Loc      Pain Edu?      Excl. in GC?    No data found.  Updated Vital Signs BP (!) 169/97 (BP Location: Left Arm)   Pulse 96   Temp 99.1 F (37.3 C) (Oral)   Resp 20   SpO2 96%   Visual Acuity Right Eye Distance:   Left Eye Distance:   Bilateral Distance:    Right Eye Near:   Left Eye Near:    Bilateral Near:     Physical Exam Vitals and nursing note reviewed.  Constitutional:      Appearance: Normal appearance. She is not ill-appearing.  HENT:     Head: Normocephalic and atraumatic.  Cardiovascular:     Rate and Rhythm: Normal rate and regular rhythm.     Pulses: Normal pulses.     Heart sounds: Normal heart sounds. No murmur heard.    No friction rub. No gallop.  Pulmonary:     Effort: Pulmonary effort is normal.     Breath sounds: Wheezing and rhonchi present.     Comments: Patient has coarse lung sounds diffusely.  Deep breathing triggers coughing. Skin:    General: Skin is warm and dry.     Capillary Refill: Capillary refill takes less than 2 seconds.  Neurological:     General: No focal deficit present.     Mental Status: She is alert and oriented to person, place, and time.      UC Treatments / Results  Labs (all labs ordered are listed, but only abnormal results are displayed) Labs Reviewed  CBC WITH DIFFERENTIAL/PLATELET - Abnormal; Notable for the following components:      Result Value   WBC 13.5 (*)    RBC  3.51 (*)    Hemoglobin 10.3 (*)    HCT 31.5 (*)    Neutro Abs 9.5 (*)    Abs Immature Granulocytes 0.16 (*)    All other components within normal limits    EKG   Radiology DG Chest 2 View  Result Date: 09/29/2022 CLINICAL DATA:  Cough shortness of breath EXAM: CHEST - 2 VIEW COMPARISON:  09/25/2022 FINDINGS: Normal heart size and vascularity. Similar minor diffuse chronic appearing  interstitial prominence without acute edema, focal pneumonia, collapse or consolidation. No effusion or pneumothorax. Trachea midline. Lower cervical fusion hardware noted. Slight levocurvature of the thoracic spine. No acute osseous finding. Old left rib fractures noted laterally. IMPRESSION: Chronic findings without acute process or interval change. Electronically Signed   By: Judie Petit.  Shick M.D.   On: 09/29/2022 17:45    Procedures Procedures (including critical care time)  Medications Ordered in UC Medications - No data to display  Initial Impression / Assessment and Plan / UC Course  I have reviewed the triage vital signs and the nursing notes.  Pertinent labs & imaging results that were available during my care of the patient were reviewed by me and considered in my medical decision making (see chart for details).   Patient is a nontoxic-appearing 57 year old female presenting for evaluation of ongoing respiratory symptoms as outlined in HPI above.  In the exam room she is able to speak in full sentences without dyspnea or tachypnea, though prolonged talking does trigger coughing.  Coughing is also triggered by deep respiration.  She was seen by her PCP 4 days ago and had a chest x-ray but there is no result available in epic and lab work.  At that visit she did have an elevated white blood cell count of 12.5 with an overall low red count and H&H of 3.66 and 10.9 and 33.6 respectively.  Her platelets were normal at 295.  Differential showed an elevated neutrophil number of 8725 but no other abnormalities.  Chemistry shows normal sodium potassium but a mildly elevated chloride of 112.  Renal function and transaminases were largely unremarkable.  The patient does have an elevated temp of 99.1 here in clinic but her respiratory rate is 20 and her room air oxygen saturation is 96%.  I do not think the patient's hallucinations are secondary to hypoxia nor the steroids as she reports they started prior to  her beginning her Medrol Dosepak.  I will repeat CBC today, as well as repeat the chest x-ray.  Chest x-ray independently reviewed and evaluated by me.  Pression: Patient has prominent pulmonary vasculature but no effusion or infiltrate.  Radiology overread is pending. Radiology impression states chronic findings without acute process or interval change.  CBC shows an elevated white count 13.5 which is increased over 4 days ago.  The patient was called in doxycycline by her PCP and I will have her pick up this antibiotic and start taking it twice daily.  I will have her stop the steroids because I do not feel they are helping her.  I will prescribe Promethazine DM cough syrup that she can use every 4-6 hours as needed for cough and congestion.  If her symptoms worsen she needs to go to the ER for evaluation.   Final Clinical Impressions(s) / UC Diagnoses   Final diagnoses:  Bronchitis     Discharge Instructions      Your chest x-ray today did not show any pneumonia.  Your exam is consistent with bronchitis however.  Stop the steroids as I do not feel they are helping you.  Pick up the doxycycline called in by your primary care provider and start taking it.  You will take it twice daily with food for 7 days.  I am going to prescribe you an albuterol inhaler.  I want you to use this with the spacer that I have prescribed.  You can have 2 puffs every 4-6 hours as needed for shortness of breath or wheezing.  This will also help with cough.  Continue the [employer] as previously prescribed for your cough during the day.  I am also can prescribe you Promethazine DM cough syrup.  You can use this every 6 hours as needed for cough and congestion.  If your symptoms continue, or they worsen, you need to go to the ER for evaluation.  In any event, you need to make a follow-up appointment with your PCP for next week to ensure that you are improving.       ED Prescriptions      Medication Sig Dispense Auth. Provider   promethazine-dextromethorphan (PROMETHAZINE-DM) 6.25-15 MG/5ML syrup Take 5 mLs by mouth 4 (four) times daily as needed. 118 mL Becky Augusta, NP   albuterol (VENTOLIN HFA) 108 (90 Base) MCG/ACT inhaler Inhale 2 puffs into the lungs every 4 (four) hours as needed. 18 g Becky Augusta, NP   Spacer/Aero-Holding Chambers (AEROCHAMBER MV) inhaler Use as instructed 1 each Becky Augusta, NP      PDMP not reviewed this encounter.   Becky Augusta, NP 09/29/22 1754

## 2022-09-29 NOTE — Addendum Note (Signed)
Addended by: Margarita Mail on: 09/29/2022 04:37 PM   Modules accepted: Orders

## 2022-09-29 NOTE — Patient Instructions (Signed)
Nancy Lucero, thank you for joining Piedad Climes, PA-C for today's virtual visit.  While this provider is not your primary care provider (PCP), if your PCP is located in our provider database this encounter information will be shared with them immediately following your visit.   A [facility] MyChart account gives you access to today's visit and all your visits, tests, and labs performed at [facility] " click here if you don't have a  [facility] MyChart account or go to [email or web address]  Consent: (Patient) Nancy Lucero provided verbal consent for this virtual visit at the beginning of the encounter.  Current Medications:  Current Outpatient Medications:    amLODipine (NORVASC) 2.5 MG tablet, TAKE 1 TABLET DAILY, Disp: 90 tablet, Rfl: 0   b complex vitamins capsule, Take by mouth., Disp: , Rfl:    benzonatate (TESSALON) 100 MG capsule, Take 1 capsule (100 mg total) by mouth 2 (two) times daily as needed for cough., Disp: 20 capsule, Rfl: 0   clonazePAM (KLONOPIN) 1 MG tablet, Take 1 mg by mouth 4 (four) times daily as needed., Disp: , Rfl:    FLUoxetine (PROZAC) 20 MG capsule, Take 60 mg by mouth daily., Disp: , Rfl:    metaxalone (SKELAXIN) 800 MG tablet, Take 1 tablet (800 mg total) by mouth 3 (three) times daily., Disp: 180 tablet, Rfl: 1   methylPREDNISolone (MEDROL DOSEPAK) 4 MG TBPK tablet, Day 1: Take 8 mg (2 tablets) before breakfast, 4 mg (1 tablet) after lunch, 4 mg (1 tablet) after supper, and 8 mg (2 tablets) at bedtime. Day 2:Take 4 mg (1 tablet) before breakfast, 4 mg (1 tablet) after lunch, 4 mg (1 tablet) after supper, and 8 mg (2 tablets) at bedtime. Day 3: Take 4 mg (1 tablet) before breakfast, 4 mg (1 tablet) after lunch, 4 mg (1 tablet) after supper, and 4 mg (1 tablet) at bedtime. Day 4: Take 4 mg (1 tablet) before breakfast, 4 mg (1 tablet) after lunch, and 4 mg (1 tablet) at bedtime. Day 5: Take 4 mg (1 tablet) before breakfast and 4 mg  (1 tablet) at bedtime. Day 6: Take 4 mg (1 tablet) before breakfast., Disp: 1 each, Rfl: 0   montelukast (SINGULAIR) 10 MG tablet, TAKE 1 TABLET AT BEDTIME, Disp: 90 tablet, Rfl: 0   Nutritional Supplements (JUICE PLUS FIBRE PO), Take 1 capsule by mouth daily., Disp: , Rfl:    OLANZapine (ZYPREXA) 7.5 MG tablet, Take 7.5 mg by mouth at bedtime., Disp: , Rfl:    olmesartan (BENICAR) 40 MG tablet, TAKE 1 TABLET DAILY, Disp: 90 tablet, Rfl: 0   omeprazole (PRILOSEC) 40 MG capsule, TAKE 1 CAPSULE(40 MG) BY MOUTH DAILY, Disp: 90 capsule, Rfl: 1   Probiotic TBEC, Take by mouth., Disp: , Rfl:    rosuvastatin (CRESTOR) 20 MG tablet, TAKE 1 TABLET DAILY, Disp: 90 tablet, Rfl: 0   traMADol (ULTRAM) 50 MG tablet, Take 1 tablet (50 mg total) by mouth every evening. Chronic back pain , takes it at night - do not take it with BZD due to risk of respiratory suppression, Disp: 90 tablet, Rfl: 0   Ubrogepant (UBRELVY) 100 MG TABS, Take 1 tablet by mouth daily as needed., Disp: 16 tablet, Rfl: 1   Medications ordered in this encounter:  No orders of the defined types were placed in this encounter.    *If you need refills on other medications prior to your next appointment, please contact your pharmacy*  Follow-Up: Call back or  seek an in-person evaluation if the symptoms worsen or if the condition fails to improve as anticipated.  [facility] [phone]  Other Instructions Call your PCP office now for follow-up. As discussed if anything is worsening you need repeat in person evaluation at nearest ER   If you have been instructed to have an in-person evaluation today at a local Urgent Care facility, please use the link below. It will take you to a list of all of our available [facility], including address, phone number and hours of operation. Please do not delay care.  [facility]  If you or a family member do not have a primary care provider, use the link below  to schedule a visit and establish care. When you choose a [facility] primary care physician or advanced practice provider, you gain a long-term partner in health. Find a Primary Care Provider  Learn more about [facility] in-office and virtual care options: [facility] - Get Care Now
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
## 2022-09-29 NOTE — Progress Notes (Signed)
Virtual Visit Consent   Nancy Lucero, you are scheduled for a virtual visit with a [facility] provider today. Just as with appointments in the office, your consent must be obtained to participate. Your consent will be active for this visit and any virtual visit you may have with one of our providers in the next 365 days. If you have a MyChart account, a copy of this consent can be sent to you electronically.  As this is a virtual visit, video technology does not allow for your provider to perform a traditional examination. This may limit your provider's ability to fully assess your condition. If your provider identifies any concerns that need to be evaluated in person or the need to arrange testing (such as labs, EKG, etc.), we will make arrangements to do so. Although advances in technology are sophisticated, we cannot ensure that it will always work on either your end or our end. If the connection with a video visit is poor, the visit may have to be switched to a telephone visit. With either a video or telephone visit, we are not always able to ensure that we have a secure connection.  By engaging in this virtual visit, you consent to the provision of healthcare and authorize for your insurance to be billed (if applicable) for the services provided during this visit. Depending on your insurance coverage, you may receive a charge related to this service.  I need to obtain your verbal consent now. Are you willing to proceed with your visit today? ROSALITA ANDERSON has provided verbal consent on 09/29/2022 for a virtual visit (video or telephone). Piedad Climes, [place]  Date: 09/29/2022 12:27 PM  Virtual Visit via Video Note   I, Piedad Climes, connected with  SHAKENYA CLAUSING  ([id number], 05-Apr-1966) on 09/29/22 at 12:15 PM EDT by a video-enabled telemedicine application and verified that I am speaking with the correct person using two identifiers.  Location: Patient: Virtual Visit  Location Patient: Home Provider: Virtual Visit Location Provider: Home Office   I discussed the limitations of evaluation and management by telemedicine and the availability of in person appointments. The patient expressed understanding and agreed to proceed.    History of Present Illness: Nancy Lucero is a 57 y.o. who identifies as a female who was assigned female at birth, and is being seen today for ongoing windedness with episodic lightheadedness despite evaluation with PCP office and being placed on steroid pack. Notes she feels she cannot get a good breath and keeping her from working. Say this has been present for months and worsening in the past week. Had x-ray at time of visit with PCP but has not received any results or calls from PCP office. No result in EMR.    HPI: HPI  Problems:  Patient Active Problem List   Diagnosis Date Noted   Positive colorectal cancer screening using Cologuard test 06/02/2022   Adenomatous polyp of colon 06/02/2022   Alcohol dependence in remission (HCC) 03/25/2021   Esophageal dysphagia 05/09/2020   History of diarrhea 05/09/2020   PTSD (post-traumatic stress disorder) 11/30/2018   Insomnia due to medical condition 11/30/2018   Torn meniscus 06/21/2018   Hypertension 09/01/2017   S/P cervical spinal fusion 12/01/2016   Pain management contract signed 12/05/2015   MVP (mitral valve prolapse) 08/28/2015   Dyslipidemia 08/28/2015   Anxiety 08/28/2015   Bipolar disorder (HCC) 08/28/2015   History of seizures 08/28/2015   Chronic back pain 10/20/2014   Cervical  radiculitis 10/20/2014   Anorexia nervosa, restricting type 10/20/2014   Fibromyalgia syndrome 10/20/2014   Fibrocystic breast disease (FCBD) in female 10/20/2014   Gastro-esophageal reflux disease without esophagitis 10/20/2014   Irritable bowel syndrome (IBS) 10/20/2014   Migraine without aura and responsive to treatment 10/20/2014   Panic attacks 10/20/2014   Spondylosis  10/20/2014   Tobacco use 10/20/2014   Mixed urge and stress incontinence 10/20/2014   Atrophy of vagina 10/20/2014    Allergies:  Allergies  Allergen Reactions   Cephalosporins Anaphylaxis   Cortisone     blackouts   Cephalexin    Lyrica [Pregabalin]     Stutters with medication    Penicillins    Sulfa Antibiotics    Sulfacetamide Sodium    Fentanyl Nausea And Vomiting    blindness   Medications:  Current Outpatient Medications:    amLODipine (NORVASC) 2.5 MG tablet, TAKE 1 TABLET DAILY, Disp: 90 tablet, Rfl: 0   b complex vitamins capsule, Take by mouth., Disp: , Rfl:    benzonatate (TESSALON) 100 MG capsule, Take 1 capsule (100 mg total) by mouth 2 (two) times daily as needed for cough., Disp: 20 capsule, Rfl: 0   clonazePAM (KLONOPIN) 1 MG tablet, Take 1 mg by mouth 4 (four) times daily as needed., Disp: , Rfl:    FLUoxetine (PROZAC) 20 MG capsule, Take 60 mg by mouth daily., Disp: , Rfl:    metaxalone (SKELAXIN) 800 MG tablet, Take 1 tablet (800 mg total) by mouth 3 (three) times daily., Disp: 180 tablet, Rfl: 1   methylPREDNISolone (MEDROL DOSEPAK) 4 MG TBPK tablet, Day 1: Take 8 mg (2 tablets) before breakfast, 4 mg (1 tablet) after lunch, 4 mg (1 tablet) after supper, and 8 mg (2 tablets) at bedtime. Day 2:Take 4 mg (1 tablet) before breakfast, 4 mg (1 tablet) after lunch, 4 mg (1 tablet) after supper, and 8 mg (2 tablets) at bedtime. Day 3: Take 4 mg (1 tablet) before breakfast, 4 mg (1 tablet) after lunch, 4 mg (1 tablet) after supper, and 4 mg (1 tablet) at bedtime. Day 4: Take 4 mg (1 tablet) before breakfast, 4 mg (1 tablet) after lunch, and 4 mg (1 tablet) at bedtime. Day 5: Take 4 mg (1 tablet) before breakfast and 4 mg (1 tablet) at bedtime. Day 6: Take 4 mg (1 tablet) before breakfast., Disp: 1 each, Rfl: 0   montelukast (SINGULAIR) 10 MG tablet, TAKE 1 TABLET AT BEDTIME, Disp: 90 tablet, Rfl: 0   Nutritional Supplements (JUICE PLUS FIBRE PO), Take 1 capsule by mouth  daily., Disp: , Rfl:    OLANZapine (ZYPREXA) 7.5 MG tablet, Take 7.5 mg by mouth at bedtime., Disp: , Rfl:    olmesartan (BENICAR) 40 MG tablet, TAKE 1 TABLET DAILY, Disp: 90 tablet, Rfl: 0   omeprazole (PRILOSEC) 40 MG capsule, TAKE 1 CAPSULE(40 MG) BY MOUTH DAILY, Disp: 90 capsule, Rfl: 1   Probiotic TBEC, Take by mouth., Disp: , Rfl:    rosuvastatin (CRESTOR) 20 MG tablet, TAKE 1 TABLET DAILY, Disp: 90 tablet, Rfl: 0   traMADol (ULTRAM) 50 MG tablet, Take 1 tablet (50 mg total) by mouth every evening. Chronic back pain , takes it at night - do not take it with BZD due to risk of respiratory suppression, Disp: 90 tablet, Rfl: 0   Ubrogepant (UBRELVY) 100 MG TABS, Take 1 tablet by mouth daily as needed., Disp: 16 tablet, Rfl: 1  Observations/Objective: Patient is well-developed, well-nourished in no acute distress.  Resting comfortably at home.  Head is normocephalic, atraumatic.  No labored breathing. Speech is clear and coherent with logical content.  Patient is alert and oriented at baseline.  Assessment and Plan: 1. Dyspnea, unspecified type  Ongoing issue being evaluated by PCP office. No improvement with steroids. Imaging still pending. She mentions hallucinations -- unsure if related to any true O2 deficiency or possibly to steroid use. ER evaluation discussed. She is wanting results of imaging and will have to reach out to PCP office since those are not available in EMR.   No charge for visit.   Follow Up Instructions: I discussed the assessment and treatment plan with the patient. The patient was provided an opportunity to ask questions and all were answered. The patient agreed with the plan and demonstrated an understanding of the instructions.  A copy of instructions were sent to the patient via MyChart unless otherwise noted below.   The patient was advised to call back or seek an in-person evaluation if the symptoms worsen or if the condition fails to improve as  anticipated.  Time:  I spent 10 minutes with the patient via telehealth technology discussing the above problems/concerns.    Piedad Climes, PA-C

## 2022-09-30 LAB — CBC WITH DIFFERENTIAL/PLATELET
Absolute Monocytes: 888 cells/uL (ref 200–950)
Basophils Absolute: 75 cells/uL (ref 0–200)
Basophils Relative: 0.6 %
Eosinophils Absolute: 350 cells/uL (ref 15–500)
Eosinophils Relative: 2.8 %
HCT: 33.6 % — ABNORMAL LOW (ref 35.0–45.0)
Hemoglobin: 10.9 g/dL — ABNORMAL LOW (ref 11.7–15.5)
Lymphs Abs: 2463 cells/uL (ref 850–3900)
MCH: 29.8 pg (ref 27.0–33.0)
MCHC: 32.4 g/dL (ref 32.0–36.0)
MCV: 91.8 fL (ref 80.0–100.0)
MPV: 11.1 fL (ref 7.5–12.5)
Monocytes Relative: 7.1 %
Neutro Abs: 8725 cells/uL — ABNORMAL HIGH (ref 1500–7800)
Neutrophils Relative %: 69.8 %
Platelets: 295 10*3/uL (ref 140–400)
RBC: 3.66 10*6/uL — ABNORMAL LOW (ref 3.80–5.10)
RDW: 13.8 % (ref 11.0–15.0)
Total Lymphocyte: 19.7 %
WBC: 12.5 10*3/uL — ABNORMAL HIGH (ref 3.8–10.8)

## 2022-09-30 LAB — COMPLETE METABOLIC PANEL WITH GFR
AG Ratio: 1.6 (calc) (ref 1.0–2.5)
ALT: 5 U/L — ABNORMAL LOW (ref 6–29)
AST: 23 U/L (ref 10–35)
Albumin: 4.1 g/dL (ref 3.6–5.1)
Alkaline phosphatase (APISO): 62 U/L (ref 37–153)
BUN: 20 mg/dL (ref 7–25)
CO2: 23 mmol/L (ref 20–32)
Calcium: 8.8 mg/dL (ref 8.6–10.4)
Chloride: 112 mmol/L — ABNORMAL HIGH (ref 98–110)
Creat: 0.69 mg/dL (ref 0.50–1.03)
Globulin: 2.6 g/dL (calc) (ref 1.9–3.7)
Glucose, Bld: 90 mg/dL (ref 65–99)
Potassium: 4.2 mmol/L (ref 3.5–5.3)
Sodium: 146 mmol/L (ref 135–146)
Total Bilirubin: 0.2 mg/dL (ref 0.2–1.2)
Total Protein: 6.7 g/dL (ref 6.1–8.1)
eGFR: 100 mL/min/{1.73_m2} (ref >=60)

## 2022-09-30 LAB — PROCALCITONIN: Procalcitonin: <0.1 ng/mL (ref <0.10)
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
## 2022-10-21 ENCOUNTER — Encounter: Payer: Self-pay | Admitting: Family Medicine

## 2022-10-21 ENCOUNTER — Ambulatory Visit (INDEPENDENT_AMBULATORY_CARE_PROVIDER_SITE_OTHER): Payer: 59 | Admitting: Family Medicine

## 2022-10-21 VITALS — BP 120/80 | HR 81 | Temp 97.5°F | Resp 18 | Ht 64.5 in | Wt 137.4 lb

## 2022-10-21 DIAGNOSIS — D509 Iron deficiency anemia, unspecified: Secondary | ICD-10-CM

## 2022-10-21 DIAGNOSIS — G43109 Migraine with aura, not intractable, without status migrainosus: Secondary | ICD-10-CM

## 2022-10-21 DIAGNOSIS — F3162 Bipolar disorder, current episode mixed, moderate: Secondary | ICD-10-CM | POA: Diagnosis not present

## 2022-10-21 DIAGNOSIS — M542 Cervicalgia: Secondary | ICD-10-CM

## 2022-10-21 DIAGNOSIS — J410 Simple chronic bronchitis: Secondary | ICD-10-CM | POA: Diagnosis not present

## 2022-10-21 DIAGNOSIS — M5441 Lumbago with sciatica, right side: Secondary | ICD-10-CM

## 2022-10-21 DIAGNOSIS — Z9109 Other allergy status, other than to drugs and biological substances: Secondary | ICD-10-CM

## 2022-10-21 DIAGNOSIS — I10 Essential (primary) hypertension: Secondary | ICD-10-CM

## 2022-10-21 DIAGNOSIS — K219 Gastro-esophageal reflux disease without esophagitis: Secondary | ICD-10-CM

## 2022-10-21 DIAGNOSIS — I700 Atherosclerosis of aorta: Secondary | ICD-10-CM

## 2022-10-21 DIAGNOSIS — Z1231 Encounter for screening mammogram for malignant neoplasm of breast: Secondary | ICD-10-CM

## 2022-10-21 DIAGNOSIS — F5001 Anorexia nervosa, restricting type: Secondary | ICD-10-CM

## 2022-10-21 DIAGNOSIS — G8929 Other chronic pain: Secondary | ICD-10-CM

## 2022-10-21 MED ORDER — MONTELUKAST SODIUM 10 MG PO TABS
10.0000 mg | ORAL_TABLET | Freq: Every day | ORAL | 1 refills | Status: DC
Start: 2022-10-21 — End: 2023-09-02

## 2022-10-21 MED ORDER — OLMESARTAN MEDOXOMIL 40 MG PO TABS
ORAL_TABLET | ORAL | 1 refills | Status: DC
Start: 2022-10-21 — End: 2023-09-02

## 2022-10-21 MED ORDER — AMLODIPINE BESYLATE 2.5 MG PO TABS: 2.5000 mg | ORAL_TABLET | Freq: Every day | ORAL | 1 refills | Status: DC

## 2022-10-21 MED ORDER — ROSUVASTATIN CALCIUM 20 MG PO TABS: 20.0000 mg | ORAL_TABLET | Freq: Every day | ORAL | 1 refills | Status: DC

## 2022-10-21 MED ORDER — OMEPRAZOLE 40 MG PO CPDR
DELAYED_RELEASE_CAPSULE | ORAL | 1 refills | Status: DC
Start: 2022-10-21 — End: 2023-09-02

## 2022-10-21 MED ORDER — NALOXONE HCL 4 MG/0.1ML NA LIQD
1.0000 | Freq: Every day | NASAL | 0 refills | Status: AC | PRN
Start: 2022-10-21 — End: ?

## 2022-10-21 MED ORDER — BREZTRI AEROSPHERE 160-9-4.8 MCG/ACT IN AERO
2.0000 | INHALATION_SPRAY | Freq: Two times a day (BID) | RESPIRATORY_TRACT | 1 refills | Status: DC
Start: 2022-10-21 — End: 2022-12-28

## 2022-10-21 MED ORDER — TRAMADOL HCL 50 MG PO TABS
50.0000 mg | ORAL_TABLET | Freq: Every evening | ORAL | 0 refills | Status: DC
Start: 2022-10-21 — End: 2023-03-12

## 2022-10-28 ENCOUNTER — Inpatient Hospital Stay: Payer: 59 | Admitting: Oncology

## 2022-10-28 ENCOUNTER — Inpatient Hospital Stay: Payer: 59

## 2022-11-02 ENCOUNTER — Inpatient Hospital Stay: Payer: 59

## 2022-11-02 ENCOUNTER — Inpatient Hospital Stay: Payer: 59 | Attending: Oncology | Admitting: Oncology

## 2022-11-02 ENCOUNTER — Encounter: Payer: Self-pay | Admitting: Oncology

## 2022-11-02 VITALS — BP 124/70 | HR 67 | Temp 98.0°F | Resp 18 | Ht 64.5 in | Wt 134.9 lb

## 2022-11-02 DIAGNOSIS — D649 Anemia, unspecified: Secondary | ICD-10-CM

## 2022-11-02 DIAGNOSIS — D509 Iron deficiency anemia, unspecified: Secondary | ICD-10-CM | POA: Diagnosis present

## 2022-11-02 DIAGNOSIS — D508 Other iron deficiency anemias: Secondary | ICD-10-CM

## 2022-11-02 LAB — COMPREHENSIVE METABOLIC PANEL WITH GFR
ALT: 10 U/L (ref 0–44)
AST: 23 U/L (ref 15–41)
Albumin: 3.9 g/dL (ref 3.5–5.0)
Alkaline Phosphatase: 43 U/L (ref 38–126)
Anion gap: 8 (ref 5–15)
BUN: 22 mg/dL — ABNORMAL HIGH (ref 6–20)
CO2: 24 mmol/L (ref 22–32)
Calcium: 8.6 mg/dL — ABNORMAL LOW (ref 8.9–10.3)
Chloride: 109 mmol/L (ref 98–111)
Creatinine, Ser: 0.71 mg/dL (ref 0.44–1.00)
GFR, Estimated: >60 mL/min
Glucose, Bld: 100 mg/dL — ABNORMAL HIGH (ref 70–99)
Potassium: 3.7 mmol/L (ref 3.5–5.1)
Sodium: 141 mmol/L (ref 135–145)
Total Bilirubin: <0.1 mg/dL — ABNORMAL LOW (ref 0.3–1.2)
Total Protein: 6.9 g/dL (ref 6.5–8.1)

## 2022-11-02 LAB — LACTATE DEHYDROGENASE: LDH: 159 U/L (ref 98–192)

## 2022-11-02 LAB — CBC WITH DIFFERENTIAL/PLATELET
Abs Immature Granulocytes: 0.02 10*3/uL (ref 0.00–0.07)
Basophils Absolute: 0.1 10*3/uL (ref 0.0–0.1)
Basophils Relative: 1 %
Eosinophils Absolute: 0.3 10*3/uL (ref 0.0–0.5)
Eosinophils Relative: 3 %
HCT: 32.6 % — ABNORMAL LOW (ref 36.0–46.0)
Hemoglobin: 10.5 g/dL — ABNORMAL LOW (ref 12.0–15.0)
Immature Granulocytes: 0 %
Lymphocytes Relative: 26 %
Lymphs Abs: 2.7 10*3/uL (ref 0.7–4.0)
MCH: 29.6 pg (ref 26.0–34.0)
MCHC: 32.2 g/dL (ref 30.0–36.0)
MCV: 91.8 fL (ref 80.0–100.0)
Monocytes Absolute: 0.4 10*3/uL (ref 0.1–1.0)
Monocytes Relative: 4 %
Neutro Abs: 7 10*3/uL (ref 1.7–7.7)
Neutrophils Relative %: 66 %
Platelets: 240 10*3/uL (ref 150–400)
RBC: 3.55 MIL/uL — ABNORMAL LOW (ref 3.87–5.11)
RDW: 15.8 % — ABNORMAL HIGH (ref 11.5–15.5)
WBC: 10.5 10*3/uL (ref 4.0–10.5)
nRBC: 0 % (ref 0.0–0.2)

## 2022-11-02 LAB — RETIC PANEL
Immature Retic Fract: 18.4 % — ABNORMAL HIGH (ref 2.3–15.9)
RBC.: 3.57 MIL/uL — ABNORMAL LOW (ref 3.87–5.11)
Retic Count, Absolute: 78.9 10*3/uL (ref 19.0–186.0)
Retic Ct Pct: 2.2 % (ref 0.4–3.1)
Reticulocyte Hemoglobin: 27.7 pg — ABNORMAL LOW (ref >27.9)

## 2022-11-02 LAB — TSH: TSH: 0.49 u[IU]/mL (ref 0.350–4.500)

## 2022-11-02 LAB — IRON AND TIBC
Iron: 23 ug/dL — ABNORMAL LOW (ref 28–170)
Saturation Ratios: 6 % — ABNORMAL LOW (ref 10.4–31.8)
TIBC: 396 ug/dL (ref 250–450)
UIBC: 373 ug/dL

## 2022-11-02 LAB — FOLATE: Folate: 8.4 ng/mL

## 2022-11-02 LAB — TECHNOLOGIST SMEAR REVIEW: Plt Morphology: ADEQUATE

## 2022-11-02 LAB — VITAMIN B12: Vitamin B-12: 313 pg/mL (ref 180–914)

## 2022-11-02 LAB — FERRITIN: Ferritin: 7 ng/mL — ABNORMAL LOW (ref 11–307)

## 2022-11-02 MED ORDER — IRON-VITAMIN C 65-125 MG PO TABS: 1.0000 | ORAL_TABLET | Freq: Every day | ORAL | 2 refills | Status: DC

## 2022-11-02 NOTE — Assessment & Plan Note (Addendum)
Previous labs are reviewed and discussed with patient.  Check cbc, smear, haptoglobin, LDH, SPEP, TSH, B12, Folate etc.   Lab Results  Component Value Date   HGB 10.5 (L) 11/02/2022   TIBC 396 11/02/2022   IRONPCTSAT 6 (L) 11/02/2022   FERRITIN 7 (L) 11/02/2022    Today's lab results are available after her visit.  Consistent with IDA We have discussed about treatment of IDA.  She declines IV Venofer and prefers oral iron supplemention.  Recommend Vitron C 1 tab daily.

## 2022-11-02 NOTE — Progress Notes (Signed)
No concerns for the provider today. 
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
## 2022-11-03 ENCOUNTER — Telehealth: Payer: Self-pay

## 2022-11-03 LAB — HAPTOGLOBIN: Haptoglobin: 239 mg/dL (ref 33–346)

## 2022-11-03 NOTE — Telephone Encounter (Signed)
Please schedule and inform pt of appts.   

## 2022-11-03 NOTE — Telephone Encounter (Signed)
-----   Message from Rickard Patience, MD sent at 11/02/2022  8:26 PM EDT ----- My chart message sent.  Please arrange her to see me in 3 months, lab prior to MD  Thanks.

## 2022-11-04 LAB — PROTEIN ELECTROPHORESIS, SERUM
A/G Ratio: 1.9 — ABNORMAL HIGH (ref 0.7–1.7)
Albumin ELP: 3.9 g/dL (ref 2.9–4.4)
Alpha-1-Globulin: 0.2 g/dL (ref 0.0–0.4)
Alpha-2-Globulin: 0.7 g/dL (ref 0.4–1.0)
Beta Globulin: 0.7 g/dL (ref 0.7–1.3)
Gamma Globulin: 0.4 g/dL (ref 0.4–1.8)
Globulin, Total: 2.1 g/dL — ABNORMAL LOW (ref 2.2–3.9)
M-Spike, %: 0.1 g/dL — ABNORMAL HIGH
Total Protein ELP: 6 g/dL (ref 6.0–8.5)

## 2022-11-10 ENCOUNTER — Telehealth: Payer: Self-pay | Admitting: *Deleted

## 2022-11-10 ENCOUNTER — Other Ambulatory Visit: Payer: Self-pay

## 2022-11-10 MED ORDER — IRON-VITAMIN C 65-125 MG PO TABS: 1.0000 | ORAL_TABLET | Freq: Every day | ORAL | 2 refills | Status: AC

## 2022-11-10 NOTE — Telephone Encounter (Signed)
Patient called stating that Vitron C was to have been sent to [facility] [address] for her and it has not been received yet

## 2022-11-10 NOTE — Telephone Encounter (Signed)
Called pharmacy, no answer. Rx has been resent.

## 2022-12-28 ENCOUNTER — Other Ambulatory Visit: Payer: Self-pay

## 2022-12-28 DIAGNOSIS — J410 Simple chronic bronchitis: Secondary | ICD-10-CM

## 2022-12-28 MED ORDER — BREZTRI AEROSPHERE 160-9-4.8 MCG/ACT IN AERO
2.0000 | INHALATION_SPRAY | Freq: Two times a day (BID) | RESPIRATORY_TRACT | 1 refills | Status: DC
Start: 2022-12-28 — End: 2023-09-02

## 2023-01-20 ENCOUNTER — Ambulatory Visit: Payer: 59 | Admitting: Family Medicine

## 2023-02-03 ENCOUNTER — Inpatient Hospital Stay: Payer: 59 | Attending: Oncology

## 2023-02-03 DIAGNOSIS — D649 Anemia, unspecified: Secondary | ICD-10-CM

## 2023-02-03 DIAGNOSIS — D508 Other iron deficiency anemias: Secondary | ICD-10-CM

## 2023-02-03 DIAGNOSIS — D509 Iron deficiency anemia, unspecified: Secondary | ICD-10-CM | POA: Insufficient documentation

## 2023-02-03 LAB — CBC WITH DIFFERENTIAL/PLATELET
Abs Immature Granulocytes: 0.04 10*3/uL (ref 0.00–0.07)
Basophils Absolute: 0.1 10*3/uL (ref 0.0–0.1)
Basophils Relative: 1 %
Eosinophils Absolute: 0.3 10*3/uL (ref 0.0–0.5)
Eosinophils Relative: 3 %
HCT: 34.6 % — ABNORMAL LOW (ref 36.0–46.0)
Hemoglobin: 10.9 g/dL — ABNORMAL LOW (ref 12.0–15.0)
Immature Granulocytes: 0 %
Lymphocytes Relative: 28 %
Lymphs Abs: 2.9 10*3/uL (ref 0.7–4.0)
MCH: 29.4 pg (ref 26.0–34.0)
MCHC: 31.5 g/dL (ref 30.0–36.0)
MCV: 93.3 fL (ref 80.0–100.0)
Monocytes Absolute: 0.9 10*3/uL (ref 0.1–1.0)
Monocytes Relative: 8 %
Neutro Abs: 6.4 10*3/uL (ref 1.7–7.7)
Neutrophils Relative %: 60 %
Platelets: 266 10*3/uL (ref 150–400)
RBC: 3.71 MIL/uL — ABNORMAL LOW (ref 3.87–5.11)
RDW: 14.3 % (ref 11.5–15.5)
WBC: 10.6 10*3/uL — ABNORMAL HIGH (ref 4.0–10.5)
nRBC: 0 % (ref 0.0–0.2)

## 2023-02-03 LAB — RETIC PANEL
Immature Retic Fract: 18.4 % — ABNORMAL HIGH (ref 2.3–15.9)
RBC.: 3.67 MIL/uL — ABNORMAL LOW (ref 3.87–5.11)
Retic Count, Absolute: 72.7 10*3/uL (ref 19.0–186.0)
Retic Ct Pct: 2 % (ref 0.4–3.1)
Reticulocyte Hemoglobin: 28.2 pg (ref >27.9)

## 2023-02-03 LAB — IRON AND TIBC
Iron: 41 ug/dL (ref 28–170)
Saturation Ratios: 9 % — ABNORMAL LOW (ref 10.4–31.8)
TIBC: 484 ug/dL — ABNORMAL HIGH (ref 250–450)
UIBC: 443 ug/dL

## 2023-02-03 LAB — FERRITIN: Ferritin: 6 ng/mL — ABNORMAL LOW (ref 11–307)

## 2023-02-09 ENCOUNTER — Inpatient Hospital Stay: Payer: 59 | Admitting: Oncology

## 2023-02-09 NOTE — Assessment & Plan Note (Deleted)
Previous labs are reviewed and discussed with patient.  Check cbc, smear, haptoglobin, LDH, SPEP, TSH, B12, Folate etc.   Lab Results  Component Value Date   HGB 10.9 (L) 02/03/2023   TIBC 484 (H) 02/03/2023   IRONPCTSAT 9 (L) 02/03/2023   FERRITIN 6 (L) 02/03/2023    Today's lab results are available after her visit.  Consistent with IDA We have discussed about treatment of IDA.  She declines IV Venofer and prefers oral iron supplemention.  Recommend Vitron C 1 tab daily.

## 2023-02-18 ENCOUNTER — Encounter (HOSPITAL_COMMUNITY): Payer: Self-pay

## 2023-02-18 ENCOUNTER — Emergency Department (HOSPITAL_COMMUNITY)
Admission: EM | Admit: 2023-02-18 | Discharge: 2023-02-19 | Discharge status: Against Medical Advice | Payer: 59 | Attending: Emergency Medicine | Admitting: Emergency Medicine

## 2023-02-18 ENCOUNTER — Other Ambulatory Visit: Payer: Self-pay

## 2023-02-18 DIAGNOSIS — Z5321 Procedure and treatment not carried out due to patient leaving prior to being seen by health care provider: Secondary | ICD-10-CM | POA: Insufficient documentation

## 2023-02-18 DIAGNOSIS — R1030 Lower abdominal pain, unspecified: Secondary | ICD-10-CM | POA: Diagnosis present

## 2023-02-18 DIAGNOSIS — K921 Melena: Secondary | ICD-10-CM | POA: Diagnosis not present

## 2023-02-18 DIAGNOSIS — R197 Diarrhea, unspecified: Secondary | ICD-10-CM | POA: Diagnosis not present

## 2023-02-18 DIAGNOSIS — K5900 Constipation, unspecified: Secondary | ICD-10-CM | POA: Diagnosis not present

## 2023-02-18 LAB — COMPREHENSIVE METABOLIC PANEL
ALT: 11 U/L (ref 0–44)
AST: 20 U/L (ref 15–41)
Albumin: 3.4 g/dL — ABNORMAL LOW (ref 3.5–5.0)
Alkaline Phosphatase: 53 U/L (ref 38–126)
Anion gap: 9 (ref 5–15)
BUN: 18 mg/dL (ref 6–20)
CO2: 23 mmol/L (ref 22–32)
Calcium: 8.7 mg/dL — ABNORMAL LOW (ref 8.9–10.3)
Chloride: 112 mmol/L — ABNORMAL HIGH (ref 98–111)
Creatinine, Ser: 0.56 mg/dL (ref 0.44–1.00)
GFR, Estimated: >60 mL/min (ref >60)
Glucose, Bld: 100 mg/dL — ABNORMAL HIGH (ref 70–99)
Potassium: 3.3 mmol/L — ABNORMAL LOW (ref 3.5–5.1)
Sodium: 144 mmol/L (ref 135–145)
Total Bilirubin: 0.3 mg/dL (ref 0.3–1.2)
Total Protein: 6.8 g/dL (ref 6.5–8.1)

## 2023-02-18 LAB — LIPASE, BLOOD: Lipase: 24 U/L (ref 11–51)

## 2023-02-18 LAB — CBC
HCT: 33.4 % — ABNORMAL LOW (ref 36.0–46.0)
Hemoglobin: 10.6 g/dL — ABNORMAL LOW (ref 12.0–15.0)
MCH: 29.5 pg (ref 26.0–34.0)
MCHC: 31.7 g/dL (ref 30.0–36.0)
MCV: 93 fL (ref 80.0–100.0)
Platelets: 264 10*3/uL (ref 150–400)
RBC: 3.59 MIL/uL — ABNORMAL LOW (ref 3.87–5.11)
RDW: 14.2 % (ref 11.5–15.5)
WBC: 7.9 10*3/uL (ref 4.0–10.5)
nRBC: 0 % (ref 0.0–0.2)

## 2023-02-18 NOTE — ED Triage Notes (Signed)
Pt reports she has had lower abd pain x 2 days and has a hx of diverticulitis and says this feels the same.  Pt reports she has diarrhea and constipation and blood in her stool.

## 2023-02-19 ENCOUNTER — Other Ambulatory Visit: Payer: Self-pay

## 2023-02-19 ENCOUNTER — Emergency Department: Payer: 59

## 2023-02-19 ENCOUNTER — Ambulatory Visit: Payer: Self-pay | Admitting: *Deleted

## 2023-02-19 ENCOUNTER — Ambulatory Visit: Payer: 59

## 2023-02-19 ENCOUNTER — Emergency Department
Admission: EM | Admit: 2023-02-19 | Discharge: 2023-02-19 | Disposition: A | Discharge status: Home / Self Care | Payer: 59 | Attending: Emergency Medicine | Admitting: Emergency Medicine

## 2023-02-19 ENCOUNTER — Encounter: Payer: Self-pay | Admitting: Intensive Care

## 2023-02-19 DIAGNOSIS — I10 Essential (primary) hypertension: Secondary | ICD-10-CM | POA: Diagnosis not present

## 2023-02-19 DIAGNOSIS — K5732 Diverticulitis of large intestine without perforation or abscess without bleeding: Secondary | ICD-10-CM | POA: Diagnosis not present

## 2023-02-19 DIAGNOSIS — R109 Unspecified abdominal pain: Secondary | ICD-10-CM | POA: Diagnosis present

## 2023-02-19 DIAGNOSIS — K5792 Diverticulitis of intestine, part unspecified, without perforation or abscess without bleeding: Secondary | ICD-10-CM

## 2023-02-19 HISTORY — DX: Pure hypercholesterolemia, unspecified: E78.00

## 2023-02-19 HISTORY — DX: Essential (primary) hypertension: I10

## 2023-02-19 HISTORY — DX: Diverticulitis of intestine, part unspecified, without perforation or abscess without bleeding: K57.92

## 2023-02-19 LAB — URINALYSIS, ROUTINE W REFLEX MICROSCOPIC
Bacteria, UA: NONE SEEN
Bilirubin Urine: NEGATIVE
Glucose, UA: NEGATIVE mg/dL
Ketones, ur: 20 mg/dL — AB
Leukocytes,Ua: NEGATIVE
Nitrite: NEGATIVE
Protein, ur: 100 mg/dL — AB
Specific Gravity, Urine: 1.028 (ref 1.005–1.030)
pH: 5 (ref 5.0–8.0)

## 2023-02-19 LAB — CBC WITH DIFFERENTIAL/PLATELET
Abs Immature Granulocytes: 0.02 10*3/uL (ref 0.00–0.07)
Basophils Absolute: 0.1 10*3/uL (ref 0.0–0.1)
Basophils Relative: 1 %
Eosinophils Absolute: 0.1 10*3/uL (ref 0.0–0.5)
Eosinophils Relative: 1 %
HCT: 36.7 % (ref 36.0–46.0)
Hemoglobin: 11.8 g/dL — ABNORMAL LOW (ref 12.0–15.0)
Immature Granulocytes: 0 %
Lymphocytes Relative: 25 %
Lymphs Abs: 2 10*3/uL (ref 0.7–4.0)
MCH: 29.3 pg (ref 26.0–34.0)
MCHC: 32.2 g/dL (ref 30.0–36.0)
MCV: 91.1 fL (ref 80.0–100.0)
Monocytes Absolute: 0.5 10*3/uL (ref 0.1–1.0)
Monocytes Relative: 6 %
Neutro Abs: 5.6 10*3/uL (ref 1.7–7.7)
Neutrophils Relative %: 67 %
Platelets: 294 10*3/uL (ref 150–400)
RBC: 4.03 MIL/uL (ref 3.87–5.11)
RDW: 14.1 % (ref 11.5–15.5)
WBC: 8.2 10*3/uL (ref 4.0–10.5)
nRBC: 0 % (ref 0.0–0.2)

## 2023-02-19 LAB — COMPREHENSIVE METABOLIC PANEL
ALT: 12 U/L (ref 0–44)
AST: 20 U/L (ref 15–41)
Albumin: 3.9 g/dL (ref 3.5–5.0)
Alkaline Phosphatase: 50 U/L (ref 38–126)
Anion gap: 11 (ref 5–15)
BUN: 13 mg/dL (ref 6–20)
CO2: 20 mmol/L — ABNORMAL LOW (ref 22–32)
Calcium: 8.9 mg/dL (ref 8.9–10.3)
Chloride: 110 mmol/L (ref 98–111)
Creatinine, Ser: 0.53 mg/dL (ref 0.44–1.00)
GFR, Estimated: >60 mL/min (ref >60)
Glucose, Bld: 98 mg/dL (ref 70–99)
Potassium: 3.4 mmol/L — ABNORMAL LOW (ref 3.5–5.1)
Sodium: 141 mmol/L (ref 135–145)
Total Bilirubin: 0.2 mg/dL — ABNORMAL LOW (ref 0.3–1.2)
Total Protein: 7.7 g/dL (ref 6.5–8.1)

## 2023-02-19 LAB — LIPASE, BLOOD: Lipase: 25 U/L (ref 11–51)

## 2023-02-19 MED ORDER — CIPROFLOXACIN HCL 500 MG PO TABS: 500.0000 mg | ORAL_TABLET | Freq: Two times a day (BID) | ORAL | 0 refills | Status: AC

## 2023-02-19 MED ORDER — CIPROFLOXACIN HCL 500 MG PO TABS
500.0000 mg | ORAL_TABLET | Freq: Two times a day (BID) | ORAL | 0 refills | Status: DC
Start: 2023-02-19 — End: 2023-02-19

## 2023-02-19 MED ORDER — ONDANSETRON 4 MG PO TBDP: 4.0000 mg | ORAL_TABLET | Freq: Three times a day (TID) | ORAL | 0 refills | Status: DC | PRN

## 2023-02-19 MED ORDER — METRONIDAZOLE 500 MG PO TABS: 500.0000 mg | ORAL_TABLET | Freq: Two times a day (BID) | ORAL | 0 refills | Status: AC

## 2023-02-19 MED ORDER — MORPHINE SULFATE (PF) 4 MG/ML IV SOLN
4.0000 mg | Freq: Once | INTRAVENOUS | Status: AC
  Administered 2023-02-19: 4 mg via INTRAVENOUS
  Filled 2023-02-19: qty 1

## 2023-02-19 MED ORDER — METRONIDAZOLE 500 MG PO TABS
500.0000 mg | ORAL_TABLET | Freq: Two times a day (BID) | ORAL | 0 refills | Status: DC
Start: 2023-02-19 — End: 2023-02-19

## 2023-02-19 MED ORDER — ONDANSETRON HCL 4 MG/2ML IJ SOLN
4.0000 mg | Freq: Once | INTRAMUSCULAR | Status: AC
  Administered 2023-02-19: 4 mg via INTRAVENOUS
  Filled 2023-02-19: qty 2

## 2023-02-19 MED ORDER — OXYCODONE-ACETAMINOPHEN 5-325 MG PO TABS
1.0000 | ORAL_TABLET | ORAL | 0 refills | Status: DC | PRN
Start: 2023-02-19 — End: 2023-03-12

## 2023-02-19 MED ORDER — IOHEXOL 300 MG/ML  SOLN
100.0000 mL | Freq: Once | INTRAMUSCULAR | Status: AC | PRN
  Administered 2023-02-19: 100 mL via INTRAVENOUS

## 2023-02-19 NOTE — Telephone Encounter (Signed)
  Chief Complaint: Abdominal Pain Symptoms: Left sided 10/10 abdominal pain, radiates to back. Bloody, mucoid stools Frequency: Tuesday Pertinent Negatives: Patient denies  Disposition: [x] ED /[] Urgent Care (no appt availability in office) / [] Appointment(In office/virtual)/ []  [facility]/ [] Home Care/ [] Refused Recommended Disposition /[] [facility]/ []  Follow-up with PCP Additional Notes:  Pt on her way [facility] ED. States went to [facility] yesterday, "They drew blood and that's all after 6 hours, I went home." Pt would like Dr. Carlynn Purl to call ED to ensure they do a CT. Advised not usually done, she will be triaged appropriately in ED. States Dr. Carlynn Purl has done it before.  Please advise. Reason for Disposition  [1] SEVERE pain (e.g., excruciating) AND [2] present > 1 hour  Answer Assessment - Initial Assessment Questions 1. LOCATION: "Where does it hurt?"      Lower left 2. RADIATION: "Does the pain shoot anywhere else?" (e.g., chest, back)     "Waves" 3. ONSET: "When did the pain begin?" (e.g., minutes, hours or days ago)      Tuesday afternoon 4. SUDDEN: "Gradual or sudden onset?"     Sudden 5. PATTERN "Does the pain come and go, or is it constant?"    - If it comes and goes: "How long does it last?" "Do you have pain now?"     (Note: Comes and goes means the pain is intermittent. It goes away completely between bouts.)    - If constant: "Is it getting better, staying the same, or getting worse?"      (Note: Constant means the pain never goes away completely; most serious pain is constant and gets worse.)      Constant 6. SEVERITY: "How bad is the pain?"  (e.g., Scale 1-10; mild, moderate, or severe)    - MILD (1-3): Doesn't interfere with normal activities, abdomen soft and not tender to touch.     - MODERATE (4-7): Interferes with normal activities or awakens from sleep, abdomen tender to touch.     - SEVERE (8-10): Excruciating pain, doubled over, unable to  do any normal activities.       10/10 7. RECURRENT SYMPTOM: "Have you ever had this type of stomach pain before?" If Yes, ask: "When was the last time?" and "What happened that time?"      Yes 8. CAUSE: "What do you think is causing the stomach pain?"     Diverticulosis. 9. RELIEVING/AGGRAVATING FACTORS: "What makes it better or worse?" (e.g., antacids, bending or twisting motion, bowel movement)     No 10. OTHER SYMPTOMS: "Do you have any other symptoms?" (e.g., back pain, diarrhea, fever, urination pain, vomiting)       Diarrhea, bloody stools, mucous  Protocols used: Abdominal Pain - Female-A-AH

## 2023-02-19 NOTE — ED Triage Notes (Signed)
Patient c/o abdominal pain since Tuesday. C/o diarrhea and lower back pain. Went to [facility] but not seen

## 2023-02-19 NOTE — ED Provider Notes (Signed)
[facility] Provider Note    Event Date/Time   First MD Initiated Contact with Patient 02/19/23 1231     (approximate)   History   Chief Complaint Abdominal Pain   HPI  Nancy Lucero is a 57 y.o. female with past medical history of hypertension, IBS, migraines, GERD, and fibromyalgia who presents to the ED complaining of abdominal pain.  Patient reports that she has had 3 days of increasing pain in the left lower quadrant of her abdomen.  She describes the pain as sharp and severe, similar to when she dealt with diverticulitis in the past.  She has been having frequent diarrhea with this along with nausea, denies any fevers or vomiting.  She has not had any dysuria, hematuria, or flank pain.     Physical Exam   Triage Vital Signs: ED Triage Vitals  Encounter Vitals Group     BP 02/19/23 1118 (!) 131/94     Systolic BP Percentile --      Diastolic BP Percentile --      Pulse Rate 02/19/23 1118 93     Resp 02/19/23 1118 20     Temp 02/19/23 1118 98.5 F (36.9 C)     Temp Source 02/19/23 1118 Oral     SpO2 02/19/23 1118 99 %     Weight 02/19/23 1112 140 lb (63.5 kg)     Height 02/19/23 1112 5\' 4"  (1.626 m)     Head Circumference --      Peak Flow --      Pain Score 02/19/23 1112 10     Pain Loc --      Pain Education --      Exclude from Growth Chart --     Most recent vital signs: Vitals:   02/19/23 1118 02/19/23 1408  BP: (!) 131/94 128/89  Pulse: 93 89  Resp: 20 18  Temp: 98.5 F (36.9 C)   SpO2: 99% 99%    Constitutional: Alert and oriented. Eyes: Conjunctivae are normal. Head: Atraumatic. Nose: No congestion/rhinnorhea. Mouth/Throat: Mucous membranes are moist.  Cardiovascular: Normal rate, regular rhythm. Grossly normal heart sounds.  2+ radial pulses bilaterally. Respiratory: Normal respiratory effort.  No retractions. Lungs CTAB. Gastrointestinal: Soft and tender to palpation in the left lower quadrant with no rebound or  guarding. No distention. Musculoskeletal: No lower extremity tenderness nor edema.  Neurologic:  Normal speech and language. No gross focal neurologic deficits are appreciated.    ED Results / Procedures / Treatments   Labs (all labs ordered are listed, but only abnormal results are displayed) Labs Reviewed  URINALYSIS, ROUTINE W REFLEX MICROSCOPIC - Abnormal; Notable for the following components:      Result Value   Color, Urine YELLOW (*)    APPearance HAZY (*)    Hgb urine dipstick SMALL (*)    Ketones, ur 20 (*)    Protein, ur 100 (*)    All other components within normal limits  CBC WITH DIFFERENTIAL/PLATELET - Abnormal; Notable for the following components:   Hemoglobin 11.8 (*)    All other components within normal limits  COMPREHENSIVE METABOLIC PANEL - Abnormal; Notable for the following components:   Potassium 3.4 (*)    CO2 20 (*)    Total Bilirubin 0.2 (*)    All other components within normal limits  LIPASE, BLOOD    RADIOLOGY CT abdomen/pelvis reviewed and interpreted by me with inflammatory changes in the left lower quadrant, no focal fluid collections or  dilated bowel loops.  PROCEDURES:  Critical Care performed: No  Procedures   MEDICATIONS ORDERED IN ED: Medications  morphine (PF) 4 MG/ML injection 4 mg (4 mg Intravenous Given 02/19/23 1343)  ondansetron (ZOFRAN) injection 4 mg (4 mg Intravenous Given 02/19/23 1343)  iohexol (OMNIPAQUE) 300 MG/ML solution 100 mL (100 mLs Intravenous Contrast Given 02/19/23 1430)     IMPRESSION / MDM / ASSESSMENT AND PLAN / ED COURSE  I reviewed the triage vital signs and the nursing notes.                              57 y.o. female with past medical history of hypertension, GERD, migraines, IBS, and fibromyalgia who presents to the ED with increasing left lower quadrant abdominal pain associate with diarrhea and nausea over the past 3 days.  Patient's presentation is most consistent with acute presentation  with potential threat to life or bodily function.  Differential diagnosis includes, but is not limited to, diverticulitis, gastroenteritis, dehydration, electrolyte abnormality, AKI, kidney stone, colitis.  Patient nontoxic-appearing and in no acute distress, vital signs are unremarkable.  Her abdomen is soft but she does seem to have focal tenderness in the left lower quadrant, reports history of complicated diverticulitis with abscess in the past.  We will further assess with CT imaging, treat symptomatically with IV morphine and Zofran.  Urinalysis shows no signs of infection, labs are pending at this time.  CT imaging consistent with acute uncomplicated diverticulitis, patient symptoms improved on reassessment.  She is appropriate for outpatient management, states she is allergic to Augmentin and we will prescribe Cipro and Flagyl.  We will also prescribe short course of pain medication is along with nausea medication.  She was counseled to follow-up with her PCP and to return to the ED for new or worsening symptoms, patient goes with plan.      FINAL CLINICAL IMPRESSION(S) / ED DIAGNOSES   Final diagnoses:  Diverticulitis     Rx / DC Orders   ED Discharge Orders          Ordered    ciprofloxacin (CIPRO) 500 MG tablet  2 times daily,   Status:  Discontinued        02/19/23 1645    metroNIDAZOLE (FLAGYL) 500 MG tablet  2 times daily,   Status:  Discontinued        02/19/23 1645    ondansetron (ZOFRAN-ODT) 4 MG disintegrating tablet  Every 8 hours PRN        02/19/23 1647    oxyCODONE-acetaminophen (PERCOCET) 5-325 MG tablet  Every 4 hours PRN        02/19/23 1647    metroNIDAZOLE (FLAGYL) 500 MG tablet  2 times daily        02/19/23 1647    ciprofloxacin (CIPRO) 500 MG tablet  2 times daily        02/19/23 1647             Note:  This document was prepared using Dragon voice recognition software and may include unintentional dictation errors.   Chesley Noon,  MD 02/19/23 [phone]

## 2023-02-20 NOTE — Plan of Care (Signed)
[facility] Tonsillectomy/Adenoidectomy, Postoperative PEDS care plan entered in error.

## 2023-03-09 ENCOUNTER — Encounter: Payer: Self-pay | Admitting: Urology

## 2023-03-09 ENCOUNTER — Ambulatory Visit (INDEPENDENT_AMBULATORY_CARE_PROVIDER_SITE_OTHER): Payer: 59 | Admitting: Urology

## 2023-03-09 VITALS — BP 133/84 | HR 98 | Ht 64.0 in | Wt 145.0 lb

## 2023-03-09 DIAGNOSIS — R102 Pelvic and perineal pain: Secondary | ICD-10-CM

## 2023-03-09 DIAGNOSIS — R310 Gross hematuria: Secondary | ICD-10-CM | POA: Diagnosis not present

## 2023-03-09 NOTE — Progress Notes (Signed)
03/09/23 3:58 PM   Nancy Lucero [birth year] [id number]  CC: Flank pain, urinary symptoms  HPI: 57 year old female with a number of medical issues including bipolar disorder, fibromyalgia, recurrent diverticulitis, IBS who is referred for flank pain and urinary symptoms.  She was recently seen in the ER on 02/19/2023 for abdominal pain and diagnosed with diverticulitis on CT and treated with antibiotics.  She has had some improvement in her symptoms.  She was previously followed by Dr. Orson Slick in urology for unclear indications, it sounds like at some point she may have had some urethral dilations, but no prior records are available to me.  She is a very challenging historian.  She does report 1 day of gross hematuria about a month ago that resolved spontaneously.  She also has some persistent urinary symptoms with urgency and some discomfort with voiding as well as intermittent lower abdominal crampy abdominal pain.  Urinalysis at ER visit was completely benign, renal function normal with creatinine 0.53.   PMH: Past Medical History:  Diagnosis Date   Atrophic vaginitis    Backache    Bipolar affective (HCC)    Bipolar disorder current episode depressed (HCC) 10/20/2014   Diverticulitis    Fibromyalgia    GERD (gastroesophageal reflux disease)    High cholesterol    Hypertension    Hypoglycemia    Irritable bowel syndrome with diarrhea    Migraine without aura with status migrainosus     Surgical History: Past Surgical History:  Procedure Laterality Date   ABDOMINAL HYSTERECTOMY     APPENDECTOMY     COLONOSCOPY WITH PROPOFOL N/A 06/02/2022   Procedure: COLONOSCOPY WITH PROPOFOL;  Surgeon: Wyline Mood, MD;  Location: [facility];  Service: Gastroenterology;  Laterality: N/A;   KNEE SURGERY Right 10/15/2015   [facility]-removed meniscus   SPINAL FUSION     Neck 3/4/5   TONSILLECTOMY     TOOTH EXTRACTION  02/03/2016   Family History: Family History  Problem Relation Age  of Onset   Mental illness Mother    Heart disease Mother    Depression Son    Mental illness Son    Mental illness Maternal Aunt    Depression Son    Mental illness Son    Depression Son    Mental illness Son    Congestive Heart Failure Father    Atrial fibrillation Father    Heart disease Father    Mental illness Sister     Social History:  reports that she has been smoking e-cigarettes and cigarettes. She started smoking about 45 years ago. She has a 45.8 pack-year smoking history. She has been exposed to tobacco smoke. She has never used smokeless tobacco. She reports current alcohol use. She reports that she does not use drugs.  Physical Exam: BP 133/84   Pulse 98   Ht 5\' 4"  (1.626 m)   Wt 145 lb (65.8 kg)   BMI 24.89 kg/m    Constitutional:  Alert and oriented, No acute distress. Cardiovascular: No clubbing, cyanosis, or edema. Respiratory: Normal respiratory effort, no increased work of breathing. GI: Abdomen is soft, nontender, nondistended, no abdominal masses   Laboratory Data: Reviewed  Pertinent Imaging: I have personally viewed and interpreted the CT scan from 02/19/2023 showing diverticulitis, no hydronephrosis.  Assessment & Plan:   57 year old female with a number of medical issues, chronic abdominal and flank pain, recent CT scan showing diverticulitis but no urologic issues, reported single episode of gross hematuria with recent benign urinalysis.  We discussed common possible etiologies of hematuria including  malignancy, urolithiasis, medical renal disease, and idiopathic. Standard workup recommended by the [facility] includes imaging with CT urogram to assess the upper tracts, and cystoscopy. Cytology is performed on patient's with gross hematuria to look for malignant cells in the urine.  I recommended cystoscopy for completion of gross hematuria workup.  We discussed possible etiologies of her symptoms including genitourinary syndrome of menopause, chronic  pelvic pain/interstitial cystitis/pelvic floor dysfunction/fibromyalgia  Legrand Rams, MD 03/09/2023  [facility] [address], [address] [place], [place] [address] [phone]

## 2023-03-09 NOTE — Patient Instructions (Signed)
Cystoscopy Cystoscopy is a procedure that is used to help diagnose and sometimes treat conditions that affect the lower urinary tract. The lower urinary tract includes the bladder and the urethra. The urethra is the tube that drains urine from the bladder. Cystoscopy is done using a thin, tube-shaped instrument with a light and camera at the end (cystoscope). The cystoscope may be hard or flexible, depending on the goal of the procedure. The cystoscope is inserted through the urethra, into the bladder. Cystoscopy may be recommended if you have: Urinary tract infections that keep coming back. Blood in the urine (hematuria). An inability to control when you urinate (urinary incontinence) or an overactive bladder. Unusual cells found in a urine sample. A blockage in the urethra, such as a urinary stone. Painful urination. An abnormality in the bladder found during an intravenous pyelogram (IVP) or CT scan. What are the risks? Generally, this is a safe procedure. However, problems may occur, including: Infection. Bleeding.  What happens during the procedure?  You will be given one or more of the following: A medicine to numb the area (local anesthetic). The area around the opening of your urethra will be cleaned. The cystoscope will be passed through your urethra into your bladder. Germ-free (sterile) fluid will flow through the cystoscope to fill your bladder. The fluid will stretch your bladder so that your health care provider can clearly examine your bladder walls. Your doctor will look at the urethra and bladder. The cystoscope will be removed The procedure may vary among health care providers  What can I expect after the procedure? After the procedure, it is common to have: Some soreness or pain in your urethra. Urinary symptoms. These include: Mild pain or burning when you urinate. Pain should stop within a few minutes after you urinate. This may last for up to a few days after the  procedure. A small amount of blood in your urine for several days. Feeling like you need to urinate but producing only a small amount of urine. Follow these instructions at home: General instructions Return to your normal activities as told by your health care provider.  Drink plenty of fluids after the procedure. Keep all follow-up visits as told by your health care provider. This is important. Contact a health care provider if you: Have pain that gets worse or does not get better with medicine, especially pain when you urinate lasting longer than 72 hours after the procedure. Have trouble urinating. Get help right away if you: Have blood clots in your urine. Have a fever or chills. Are unable to urinate. Summary Cystoscopy is a procedure that is used to help diagnose and sometimes treat conditions that affect the lower urinary tract. Cystoscopy is done using a thin, tube-shaped instrument with a light and camera at the end. After the procedure, it is common to have some soreness or pain in your urethra. It is normal to have blood in your urine after the procedure.  If you were prescribed an antibiotic medicine, take it as told by your health care provider.  This information is not intended to replace advice given to you by your health care provider. Make sure you discuss any questions you have with your health care provider. Document Revised: 04/12/2018 Document Reviewed: 04/12/2018 Elsevier Patient Education  2020 Elsevier Inc.  Eating Plan for Interstitial Cystitis Interstitial cystitis (IC) is a long-term (chronic) condition that can cause pain and pressure near your bladder. It can also make you have to pee urgently and  often. Symptoms may come and go. Certain foods may trigger your symptoms. Learning what foods bother you can help you come up with an eating plan to manage IC. What are tips for following this plan? You may want to work with an expert in healthy eating called a  dietitian. They can help you make an eating plan by doing an elimination diet. This diet involves: Making a list of foods that you think trigger your symptoms. You may also want to include foods that often cause symptoms in other people with IC. It may take a few months to find out which foods bother you. Taking those foods out of your diet for about a month. After that month, you can try to have the foods again one at a time to see which ones cause symptoms. Reading food labels Once you know which foods trigger your symptoms, you can avoid them. But it's still a good idea to read food labels. Some foods that cause your symptoms may be ingredients in other foods. These foods may include: Soy. Worcestershire sauce. Vinegar. Alcohol. Artificial sweeteners. Monosodium glutamate. Other triggers may include: Chili peppers. Tomato products. Citrus fruits, flavors, or juices. Shopping Shopping can be hard if many foods trigger your IC. Bring a list of the foods you can't eat with you when you go to the store. You can get an app for your phone that lets you know which foods are safer and which ones you may want to avoid. You can find the app at the [employer] website: ic-network.com Meal planning Plan your meals based on the results of your elimination diet. If you haven't done the diet yet, plan meals based on IC food lists. These lists may be given to you by your health care provider or dietitian. The lists tell you which foods are least and most likely to cause symptoms. Avoid certain types of food when you go out to eat. These may include: Pizza. [place] food. [place] food. [place] food. General information Do not eat large portions. Drink lots of fluids with your meals. Do not eat foods that are high in sugar, salt, or saturated fat. Choose whole fruits instead of juice. Eat a colorful variety of vegetables. Find ways to manage stress. Get enough exercise. What foods should I eat? For  people with IC, the best diet is a balanced one. This means it includes things from all the food groups. Even if you have to avoid certain foods, there are still lots of healthy choices in each group. Below are some foods that may be safest for you to eat: Fruits Bananas. Blueberries and blueberry juice. Melons. Pears. Apples. Dates. Prunes. Raisins. Apricots. Vegetables Asparagus. Avocado. Celery. Beets. Bell peppers. Black olives. Broccoli. Brussels sprouts. Cabbage. Carrots. Cauliflower. Cucumber. Eggplant. Green beans. Potatoes. Radishes. Spinach. Squash. Turnips. Zucchini. Mushrooms. Peas. Grains Oats. Rice. Bran. Oatmeal. Whole wheat bread. Meats and other proteins Beef. Fish and other seafood. Eggs. Nuts. Peanut butter. Pork. Poultry. Lamb. Garbanzo beans. Pinto beans. Dairy Whole or low-fat milk. American, mozzarella, mild cheddar, feta, ricotta, and cream cheeses. The items listed above may not be all the foods and drinks you can have. Talk to a dietitian to learn more. What foods should I avoid? You should avoid any foods that cause symptoms. It's also a good idea to avoid foods that often cause symptoms in many people with IC. These foods include: Fruits Citrus fruits, such as lemons, limes, oranges, and grapefruit. Cranberries. Strawberries. Pineapple. Kiwi. Vegetables Chili peppers. Onions.  Sauerkraut. Tomato and tomato products. Rosita Fire. Grains You don't need to avoid any type of grain unless it causes symptoms. Meats and other proteins Precooked or cured meats, such as sausages or meat loaves. Soy products. Dairy Chocolate ice cream. Processed cheese. Yogurt. Drinks Alcohol. Chocolate drinks. Coffee. Cranberry juice. Fizzy drinks. Black, green, or herbal tea. Tomato juice. Sports drinks. The items listed above may not be all the foods and drinks you should avoid. Talk to a dietitian to learn more. This information is not intended to replace advice given to you by your health  care provider. Make sure you discuss any questions you have with your health care provider. Document Revised: 07/30/2022 Document Reviewed: 07/30/2022 Elsevier Patient Education  2024 Elsevier Inc.  Diverticulitis  Diverticulitis happens when poop (stool) and bacteria get trapped in small pouches in the colon called diverticula. These pouches may form if you have a condition called diverticulosis. When the poop and bacteria get trapped, it can cause an infection and inflammation. Diverticulitis may cause severe stomach pain and diarrhea. It can also lead to tissue damage in your colon. This can cause bleeding or blockage. In some cases, the diverticula may burst (rupture). This can cause infected poop to go into other parts of your abdomen. What are the causes? This condition is caused by poop getting trapped in the diverticula. This allows bacteria to grow. It can lead to inflammation and infection. What increases the risk? You are more likely to get this condition if you have diverticulosis. You are also more at risk if: You are overweight or obese. You do not get enough exercise. You drink alcohol. You smoke. You eat a lot of red meat, such as beef, pork, or lamb. You do not get enough fiber. Foods high in fiber include fruits, vegetables, beans, nuts, and whole grains. You are over 17 years of age. What are the signs or symptoms? Symptoms of this condition may include: Pain and tenderness in the abdomen. This pain is often felt on the left side but may occur in other spots. Fever and chills. Nausea and vomiting. Cramping. Bloating. Changes in how often you poop. Blood in your poop. How is this diagnosed? This condition is diagnosed based on your medical history and a physical exam. You may also have tests done to make sure there is nothing else causing your condition. These tests may include: Blood tests. Tests done on your pee (urine). A CT scan of the abdomen. You may need to  have a colonoscopy. This is an exam to look at your whole large intestine. During the exam, a tube is put into the opening of your butt (anus) and then moved into your rectum, colon, and other parts of the large intestine. This exam is done to look at the diverticula. It can also see if there is something else that may be causing your symptoms. How is this treated? Most cases are mild and can be treated at home. You may be told to: Take over-the-counter pain medicine. Only eat and drink clear liquids. Take antibiotics. Rest. More severe cases may need to be treated at a hospital. Treatment may include: Not eating or drinking. Taking pain medicines. Getting antibiotics through an IV. Getting fluids and nutrition through an IV. Surgery. Follow these instructions at home: Medicines Take over-the-counter and prescription medicines only as told by your health care provider. These include fiber supplements, probiotics, and medicines to soften your poop (stool softeners). If you were prescribed antibiotics, take them as told  by your provider. Do not stop using the antibiotic even if you start to feel better. Ask your provider if the medicine prescribed to you requires you to avoid driving or using machinery. Eating and drinking  Follow the diet told by your provider. You may need to only eat and drink liquids. After your symptoms get better, you may be able to return to a more normal diet. You may be told to eat at least 25 grams (25 g) of fiber each day. Fiber makes it easier to poop. Healthy sources of fiber include: Berries. One cup has 4-8 g of fiber. Beans or lentils. One-half cup has 5-8 g of fiber. Green vegetables. One cup has 4 g of fiber. Avoid eating red meat. General instructions Do not use any products that contain nicotine or tobacco. These products include cigarettes, chewing tobacco, and vaping devices, such as e-cigarettes. If you need help quitting, ask your provider. Exercise  for at least 30 minutes, 3 times a week. Exercise hard enough to raise your heart rate and break a sweat. Contact a health care provider if: Your pain gets worse. Your pooping does not go back to normal. Your symptoms do not get better with treatment. Your symptoms get worse all of a sudden. You have a fever. You vomit more than one time. Your poop is bloody, black, or tarry. This information is not intended to replace advice given to you by your health care provider. Make sure you discuss any questions you have with your health care provider. Document Revised: 01/15/2022 Document Reviewed: 01/15/2022 Elsevier Patient Education  2024 [employer].
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
## 2023-03-12 ENCOUNTER — Ambulatory Visit
Admission: RE | Admit: 2023-03-12 | Discharge: 2023-03-12 | Disposition: A | Discharge status: Home / Self Care | Payer: 59 | Source: Ambulatory Visit | Attending: Family Medicine | Admitting: Family Medicine

## 2023-03-12 ENCOUNTER — Telehealth: Payer: Self-pay | Admitting: Family Medicine

## 2023-03-12 ENCOUNTER — Encounter: Payer: Self-pay | Admitting: Family Medicine

## 2023-03-12 ENCOUNTER — Other Ambulatory Visit: Payer: Self-pay

## 2023-03-12 ENCOUNTER — Ambulatory Visit: Payer: 59 | Admitting: Family Medicine

## 2023-03-12 VITALS — BP 138/88 | HR 98 | Temp 98.5°F | Resp 18 | Ht 64.0 in | Wt 144.6 lb

## 2023-03-12 DIAGNOSIS — R140 Abdominal distension (gaseous): Secondary | ICD-10-CM

## 2023-03-12 DIAGNOSIS — Z23 Encounter for immunization: Secondary | ICD-10-CM | POA: Diagnosis not present

## 2023-03-12 DIAGNOSIS — R1032 Left lower quadrant pain: Secondary | ICD-10-CM

## 2023-03-12 DIAGNOSIS — R1011 Right upper quadrant pain: Secondary | ICD-10-CM | POA: Insufficient documentation

## 2023-03-12 DIAGNOSIS — K5792 Diverticulitis of intestine, part unspecified, without perforation or abscess without bleeding: Secondary | ICD-10-CM | POA: Insufficient documentation

## 2023-03-12 DIAGNOSIS — Z8719 Personal history of other diseases of the digestive system: Secondary | ICD-10-CM

## 2023-03-12 DIAGNOSIS — G8929 Other chronic pain: Secondary | ICD-10-CM

## 2023-03-12 DIAGNOSIS — M5441 Lumbago with sciatica, right side: Secondary | ICD-10-CM | POA: Diagnosis not present

## 2023-03-12 MED ORDER — TRAMADOL HCL 50 MG PO TABS: 50.0000 mg | ORAL_TABLET | Freq: Every evening | ORAL | 0 refills | Status: DC

## 2023-03-12 MED ORDER — METAXALONE 800 MG PO TABS
800.0000 mg | ORAL_TABLET | Freq: Three times a day (TID) | ORAL | 0 refills | Status: DC
Start: 2023-03-12 — End: 2023-09-02

## 2023-03-12 MED ORDER — IOHEXOL 300 MG/ML  SOLN
100.0000 mL | Freq: Once | INTRAMUSCULAR | Status: AC | PRN
  Administered 2023-03-12: 100 mL via INTRAVENOUS

## 2023-03-12 NOTE — Telephone Encounter (Signed)
Patient has called in regards to her Tramadol that Dr Carlynn Purl prescribed. She States that Dr Carlynn Purl originally prescribed a 3 month supply/90 tabs of this medication but her insurance company won't approve this. Patient states that her insurance company states they can only approve her to pick up 7 Tramadol tablets and after she finishes those, then they will discuss filling 30. Patient states she went through this with previous insurance company and Dr Carlynn Purl had to call her insurance company to fix this. Patient states she is okay with 30 tablets being filled at a time if her insurance will cover this. Please advise and follow back up with the patient @ # [phone].

## 2023-03-16 ENCOUNTER — Ambulatory Visit: Payer: 59 | Admitting: Urology

## 2023-03-16 NOTE — Telephone Encounter (Signed)
Called and lvm for patient

## 2023-03-23 ENCOUNTER — Other Ambulatory Visit: Payer: Self-pay | Admitting: Urology

## 2023-04-07 ENCOUNTER — Ambulatory Visit: Payer: 59 | Admitting: Family Medicine

## 2023-05-10 ENCOUNTER — Ambulatory Visit: Payer: 59 | Admitting: Family Medicine

## 2023-05-21 ENCOUNTER — Ambulatory Visit (INDEPENDENT_AMBULATORY_CARE_PROVIDER_SITE_OTHER): Payer: 59 | Admitting: Family Medicine

## 2023-05-21 ENCOUNTER — Encounter: Payer: Self-pay | Admitting: Family Medicine

## 2023-05-21 VITALS — BP 152/84 | HR 99 | Resp 16 | Ht 64.0 in | Wt 136.4 lb

## 2023-05-21 DIAGNOSIS — Z91199 Patient's noncompliance with other medical treatment and regimen due to unspecified reason: Secondary | ICD-10-CM

## 2023-05-21 DIAGNOSIS — M5441 Lumbago with sciatica, right side: Secondary | ICD-10-CM | POA: Diagnosis not present

## 2023-05-21 DIAGNOSIS — G43109 Migraine with aura, not intractable, without status migrainosus: Secondary | ICD-10-CM

## 2023-05-21 DIAGNOSIS — F50014 Anorexia nervosa, restricting type, in remission: Secondary | ICD-10-CM

## 2023-05-21 DIAGNOSIS — I700 Atherosclerosis of aorta: Secondary | ICD-10-CM | POA: Diagnosis not present

## 2023-05-21 DIAGNOSIS — K219 Gastro-esophageal reflux disease without esophagitis: Secondary | ICD-10-CM

## 2023-05-21 DIAGNOSIS — J410 Simple chronic bronchitis: Secondary | ICD-10-CM

## 2023-05-21 DIAGNOSIS — I10 Essential (primary) hypertension: Secondary | ICD-10-CM

## 2023-05-21 DIAGNOSIS — F3162 Bipolar disorder, current episode mixed, moderate: Secondary | ICD-10-CM

## 2023-05-21 DIAGNOSIS — Z8719 Personal history of other diseases of the digestive system: Secondary | ICD-10-CM

## 2023-05-21 DIAGNOSIS — G8929 Other chronic pain: Secondary | ICD-10-CM

## 2023-05-21 DIAGNOSIS — M542 Cervicalgia: Secondary | ICD-10-CM
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
## 2023-06-07 ENCOUNTER — Ambulatory Visit: Payer: 59 | Admitting: Physician Assistant

## 2023-06-07 NOTE — Progress Notes (Deleted)
 Celso Amy, PA-C [address]  [address]  [place], [place] [address]  Main: [phone]  Fax: [phone]   Gastroenterology Consultation  Referring Provider:     Alba Cory, MD Primary Care Physician:  Alba Cory, MD Primary Gastroenterologist:  *** Reason for Consultation:     Abdominal pain        HPI:   Nancy Lucero is a 58 y.o. y/o female referred for consultation & management  by Alba Cory, MD.    History of GERD.  Takes omeprazole 40 Mg once daily.  Zofran as needed nausea.  She is having a lot of nausea and vomiting episodes.  She thinks stress and anxiety are contributing to nausea and vomiting.  No previous EGD.  Still has gallbladder.  Has history of iron deficiency anemia.  Saw Dr. Cathie Hoops hematologist.  Currently takes slow iron tablet daily.  She also is taking daily Goody powders for chronic pain.  05/2022 colonoscopy by Dr. Tobi Bastos: (For positive Cologuard): Excellent prep.  1 small 8 mm tubular adenoma polyp removed from ascending colon.  Right colon diverticulosis.  7-year repeat.  Remote history of mild acute sigmoid diverticulitis (02/2023).  03/2023: CT abdomen pelvis with contrast: Diverticulosis.  Resolution of previous sigmoid diverticulitis.  No abscess.  Previous hysterectomy.  Contracted gallbladder with no obvious gallstones.  Fatty liver.  Otherwise normal.  02/2023 labs: Hemoglobin 11.8, normal LFTs, slightly low potassium 3.4.  Normal lipase.  Has medical history of fibromyalgia, chronic pain, PTSD, anxiety, bipolar disorder, GERD, IBS, tobacco use, alcohol dependence in remission, anorexia nervosa, history of seizures.  Past Medical History:  Diagnosis Date   Atrophic vaginitis    Backache    Bipolar affective (HCC)    Bipolar disorder current episode depressed (HCC) 10/20/2014   Diverticulitis    Fibromyalgia    GERD (gastroesophageal reflux disease)    High cholesterol    Hypertension    Hypoglycemia    Irritable  bowel syndrome with diarrhea    Migraine without aura with status migrainosus     Past Surgical History:  Procedure Laterality Date   ABDOMINAL HYSTERECTOMY     APPENDECTOMY     COLONOSCOPY WITH PROPOFOL N/A 06/02/2022   Procedure: COLONOSCOPY WITH PROPOFOL;  Surgeon: Wyline Mood, MD;  Location: [facility] ENDOSCOPY;  Service: Gastroenterology;  Laterality: N/A;   KNEE SURGERY Right 10/15/2015   [facility]-removed meniscus   SPINAL FUSION     Neck 3/4/5   TONSILLECTOMY     TOOTH EXTRACTION  02/03/2016    Prior to Admission medications   Medication Sig Start Date End Date Taking? Authorizing Provider  albuterol (VENTOLIN HFA) 108 (90 Base) MCG/ACT inhaler Inhale 2 puffs into the lungs every 4 (four) hours as needed. 09/29/22   Becky Augusta, NP  amLODipine (NORVASC) 2.5 MG tablet Take 1 tablet (2.5 mg total) by mouth daily. TAKE 1 TABLET DAILY 10/21/22   Alba Cory, MD  b complex vitamins capsule Take by mouth.    [provider]  Budeson-Glycopyrrol-Formoterol (BREZTRI AEROSPHERE) 160-9-4.8 MCG/ACT AERO Inhale 2 puffs into the lungs 2 (two) times daily. 12/28/22   Alba Cory, MD  clonazePAM (KLONOPIN) 1 MG tablet Take 1 mg by mouth 4 (four) times daily as needed. 08/19/19   Milagros Evener, MD  FLUoxetine (PROZAC) 20 MG capsule Take 60 mg by mouth daily. 10/18/21   Milagros Evener, MD  Iron-Vitamin C 65-125 MG TABS Take 1 tablet by mouth daily. 11/10/22   Rickard Patience, MD  metaxalone (  SKELAXIN) 800 MG tablet Take 1 tablet (800 mg total) by mouth 3 (three) times daily. 03/12/23   Alba Cory, MD  montelukast (SINGULAIR) 10 MG tablet Take 1 tablet (10 mg total) by mouth at bedtime. 10/21/22   Alba Cory, MD  naloxone [facility]) nasal spray 4 mg/0.1 mL Place 1 spray into the nose daily as needed. 10/21/22   Alba Cory, MD  OLANZapine (ZYPREXA) 7.5 MG tablet Take 7.5 mg by mouth at bedtime. 08/31/19   Milagros Evener, MD  olmesartan (BENICAR) 40 MG tablet TAKE 1 TABLET DAILY 10/21/22    Alba Cory, MD  omeprazole (PRILOSEC) 40 MG capsule TAKE 1 CAPSULE(40 MG) BY MOUTH DAILY 10/21/22   Carlynn Purl, Danna Hefty, MD  ondansetron (ZOFRAN-ODT) 4 MG disintegrating tablet Take 1 tablet (4 mg total) by mouth every 8 (eight) hours as needed for nausea or vomiting. 02/19/23   Chesley Noon, MD  rosuvastatin (CRESTOR) 20 MG tablet Take 1 tablet (20 mg total) by mouth daily. TAKE 1 TABLET DAILY 10/21/22   Alba Cory, MD  Spacer/Aero-Holding Chambers (AEROCHAMBER MV) inhaler Use as instructed 09/29/22   Becky Augusta, NP  traMADol (ULTRAM) 50 MG tablet Take 1 tablet (50 mg total) by mouth every evening. Chronic back pain , takes it at night - do not take it with BZD due to risk of respiratory suppression 03/12/23   Carlynn Purl, Danna Hefty, MD  Ubrogepant (UBRELVY) 100 MG TABS Take 1 tablet by mouth daily as needed. 04/22/22   Alba Cory, MD    Family History  Problem Relation Age of Onset   Mental illness Mother    Heart disease Mother    Depression Son    Mental illness Son    Mental illness Maternal Aunt    Depression Son    Mental illness Son    Depression Son    Mental illness Son    Congestive Heart Failure Father    Atrial fibrillation Father    Heart disease Father    Mental illness Sister      Social History   Tobacco Use   Smoking status: Every Day    Current packs/day: 1.00    Average packs/day: 1 pack/day for 46.1 years (46.1 ttl pk-yrs)    Types: E-cigarettes, Cigarettes    Start date: 05/04/1977    Passive exposure: Current   Smokeless tobacco: Never   Tobacco comments:    smoking a couple daily again, she had quit   Vaping Use   Vaping status: Former  Substance Use Topics   Alcohol use: Yes   Drug use: No    Allergies as of 06/07/2023 - Review Complete 05/21/2023  Allergen Reaction Noted   Cephalosporins Anaphylaxis 05/15/2014   Cortisone  09/23/2015   Cephalexin  10/19/2014   Lyrica [pregabalin]  02/10/2018   Penicillins  10/19/2014   Sulfa antibiotics   10/19/2014   Sulfacetamide sodium  08/28/2015   Fentanyl Nausea And Vomiting 08/28/2015    Review of Systems:    All systems reviewed and negative except where noted in HPI.   Physical Exam:  There were no vitals taken for this visit. No LMP recorded. Patient has had a hysterectomy.  General:   Alert,  Well-developed, well-nourished, pleasant and cooperative in NAD Lungs:  Respirations even and unlabored.  Clear throughout to auscultation.   No wheezes, crackles, or rhonchi. No acute distress. Heart:  Regular rate and rhythm; no murmurs, clicks, rubs, or gallops. Abdomen:  Normal bowel sounds.  No bruits.  Soft, and non-distended without  masses, hepatosplenomegaly or hernias noted.  No Tenderness.  No guarding or rebound tenderness.    Neurologic:  Alert and oriented x3;  grossly normal neurologically. Psych:  Alert and cooperative. Normal mood and affect.  Imaging Studies: No results found.  Assessment and Plan:   Nancy Lucero is a 58 y.o. y/o female has been referred for   1.  Abdominal pain  H. pylori test  EGD  RUQ ultrasound   2.  History of iron deficiency anemia  Labs CBC, iron panel, ferritin  3.  Chronic nausea and vomiting  RUQ ultrasound -rule out gallstones  Scheduling EGD I discussed risks of EGD with patient to include risk of bleeding, perforation, and risk of sedation.  Patient expressed understanding and agrees to proceed with EGD.   4.  History of diverticulitis  Treat with antibiotics if LLQ pain/tenderness  Follow up ***  Celso Amy, PA-C    BP check ***

## 2023-08-19 ENCOUNTER — Emergency Department
Admission: EM | Admit: 2023-08-19 | Discharge: 2023-08-19 | Disposition: A | Discharge status: Home / Self Care | Payer: Self-pay | Attending: Emergency Medicine | Admitting: Emergency Medicine

## 2023-08-19 ENCOUNTER — Emergency Department: Payer: Self-pay

## 2023-08-19 ENCOUNTER — Other Ambulatory Visit: Payer: Self-pay

## 2023-08-19 DIAGNOSIS — K529 Noninfective gastroenteritis and colitis, unspecified: Secondary | ICD-10-CM | POA: Insufficient documentation

## 2023-08-19 DIAGNOSIS — R748 Abnormal levels of other serum enzymes: Secondary | ICD-10-CM | POA: Insufficient documentation

## 2023-08-19 DIAGNOSIS — E876 Hypokalemia: Secondary | ICD-10-CM | POA: Insufficient documentation

## 2023-08-19 DIAGNOSIS — D649 Anemia, unspecified: Secondary | ICD-10-CM | POA: Insufficient documentation

## 2023-08-19 LAB — URINALYSIS, ROUTINE W REFLEX MICROSCOPIC
Bacteria, UA: NONE SEEN
Glucose, UA: NEGATIVE mg/dL
Hgb urine dipstick: NEGATIVE
Ketones, ur: NEGATIVE mg/dL
Leukocytes,Ua: NEGATIVE
Nitrite: NEGATIVE
Protein, ur: 100 mg/dL — AB
Specific Gravity, Urine: 1.038 — ABNORMAL HIGH (ref 1.005–1.030)
Squamous Epithelial / HPF: 0 /HPF (ref 0–5)
WBC, UA: 0 WBC/hpf (ref 0–5)
pH: 5 (ref 5.0–8.0)

## 2023-08-19 LAB — COMPREHENSIVE METABOLIC PANEL WITH GFR
ALT: 8 U/L (ref 0–44)
AST: 24 U/L (ref 15–41)
Albumin: 3.7 g/dL (ref 3.5–5.0)
Alkaline Phosphatase: 64 U/L (ref 38–126)
Anion gap: 10 (ref 5–15)
BUN: 15 mg/dL (ref 6–20)
CO2: 27 mmol/L (ref 22–32)
Calcium: 8.6 mg/dL — ABNORMAL LOW (ref 8.9–10.3)
Chloride: 107 mmol/L (ref 98–111)
Creatinine, Ser: 0.62 mg/dL (ref 0.44–1.00)
GFR, Estimated: >60 mL/min (ref >60)
Glucose, Bld: 97 mg/dL (ref 70–99)
Potassium: 3.1 mmol/L — ABNORMAL LOW (ref 3.5–5.1)
Sodium: 144 mmol/L (ref 135–145)
Total Bilirubin: 0.4 mg/dL (ref 0.0–1.2)
Total Protein: 7.1 g/dL (ref 6.5–8.1)

## 2023-08-19 LAB — CBC
HCT: 36.6 % (ref 36.0–46.0)
Hemoglobin: 11.4 g/dL — ABNORMAL LOW (ref 12.0–15.0)
MCH: 28.7 pg (ref 26.0–34.0)
MCHC: 31.1 g/dL (ref 30.0–36.0)
MCV: 92.2 fL (ref 80.0–100.0)
Platelets: 246 10*3/uL (ref 150–400)
RBC: 3.97 MIL/uL (ref 3.87–5.11)
RDW: 15.2 % (ref 11.5–15.5)
WBC: 6.4 10*3/uL (ref 4.0–10.5)
nRBC: 0 % (ref 0.0–0.2)

## 2023-08-19 LAB — RESP PANEL BY RT-PCR (RSV, FLU A&B, COVID)  RVPGX2
Influenza A by PCR: NEGATIVE
Influenza B by PCR: NEGATIVE
Resp Syncytial Virus by PCR: NEGATIVE
SARS Coronavirus 2 by RT PCR: NEGATIVE

## 2023-08-19 LAB — TROPONIN I (HIGH SENSITIVITY)
Troponin I (High Sensitivity): 9 ng/L (ref <18)
Troponin I (High Sensitivity): 10 ng/L

## 2023-08-19 LAB — LIPASE, BLOOD: Lipase: 78 U/L — ABNORMAL HIGH (ref 11–51)

## 2023-08-19 MED ORDER — DICYCLOMINE HCL 10 MG PO CAPS: 10.0000 mg | ORAL_CAPSULE | Freq: Three times a day (TID) | ORAL | 0 refills | Status: DC

## 2023-08-19 MED ORDER — ONDANSETRON 4 MG PO TBDP: 4.0000 mg | ORAL_TABLET | Freq: Three times a day (TID) | ORAL | 0 refills | Status: AC | PRN

## 2023-08-19 MED ORDER — SODIUM CHLORIDE 0.9 % IV BOLUS
1000.0000 mL | Freq: Once | INTRAVENOUS | Status: AC
  Administered 2023-08-19: 1000 mL via INTRAVENOUS

## 2023-08-19 MED ORDER — IOHEXOL 300 MG/ML  SOLN
100.0000 mL | Freq: Once | INTRAMUSCULAR | Status: AC | PRN
  Administered 2023-08-19: 100 mL via INTRAVENOUS

## 2023-08-19 MED ORDER — ONDANSETRON HCL 4 MG/2ML IJ SOLN
4.0000 mg | Freq: Once | INTRAMUSCULAR | Status: AC
  Administered 2023-08-19: 4 mg via INTRAVENOUS
  Filled 2023-08-19: qty 2

## 2023-08-19 MED ORDER — MORPHINE SULFATE (PF) 4 MG/ML IV SOLN
4.0000 mg | Freq: Once | INTRAVENOUS | Status: AC
  Administered 2023-08-19: 4 mg via INTRAVENOUS
  Filled 2023-08-19: qty 1

## 2023-08-19 NOTE — Discharge Instructions (Addendum)
 Your workup was reassuring you should take Tylenol 1 g every 8 hours, ibuprofen 600 every 8 hours with food to help with pain.  Return to the ER for worsening symptoms or any other concerns    MPRESSION:  1. No acute localizing process in the abdomen or pelvis.  2. Colonic diverticulosis without evidence for diverticulitis.  3. 3 mm left solid pulmonary nodule. No follow-up needed if patient  is low-risk.This recommendation follows the consensus statement:  Guidelines for Management of Incidental Pulmonary Nodules Detected  on CT Images: From the Fleischner Society 2017; Radiology 2017;  [id number].    Aortic Atherosclerosis (ICD10-I70.0).

## 2023-08-19 NOTE — ED Triage Notes (Signed)
 Pt here with abd pain since Tuesday. Pt endorses nausea and vomiting. Pt also endorses fatigue and muscle aches. Pt states she had a fever at home. Pt took her bp medication this morning as well.

## 2023-08-19 NOTE — ED Notes (Signed)
 MD made aware of BP 195/106.

## 2023-08-19 NOTE — ED Provider Notes (Signed)
 [facility] Provider Note    Event Date/Time   First MD Initiated Contact with Patient 08/19/23 1503     (approximate)   History   Abdominal Pain   HPI  Nancy Lucero is a 58 y.o. female with a history of diverticulitis as well as complications with abscess who comes in with concerns for abdominal pain.  Patient reports right lower quadrant abdominal pain for the past 2 days.  She had some nausea vomiting diarrhea initially but now states the diarrhea is all resolved and she continues to have significant right lower quadrant pain.  She denies any chest pain, shortness of breath.  No head trauma. Does report some acid reflux symptoms.  Patient reports that her abdominal pain has been associated with some nausea, vomiting   Physical Exam   Triage Vital Signs: ED Triage Vitals  Encounter Vitals Group     BP 08/19/23 1302 (!) 178/98     Systolic BP Percentile --      Diastolic BP Percentile --      Pulse Rate 08/19/23 1302 93     Resp 08/19/23 1302 17     Temp 08/19/23 1302 98.3 F (36.8 C)     Temp Source 08/19/23 1302 Oral     SpO2 08/19/23 1302 100 %     Weight 08/19/23 1303 136 lb 7.4 oz (61.9 kg)     Height 08/19/23 1303 5\' 4"  (1.626 m)     Head Circumference --      Peak Flow --      Pain Score 08/19/23 1303 10     Pain Loc --      Pain Education --      Exclude from Growth Chart --     Most recent vital signs: Vitals:   08/19/23 1302  BP: (!) 178/98  Pulse: 93  Resp: 17  Temp: 98.3 F (36.8 C)  SpO2: 100%     General: Awake, no distress.  CV:  Good peripheral perfusion.  Resp:  Normal effort.  Abd:  No distention.  Tender in the right lower abdomen. Other:     ED Results / Procedures / Treatments   Labs (all labs ordered are listed, but only abnormal results are displayed) Labs Reviewed  LIPASE, BLOOD - Abnormal; Notable for the following components:      Result Value   Lipase 78 (*)    All other components within  normal limits  COMPREHENSIVE METABOLIC PANEL WITH GFR - Abnormal; Notable for the following components:   Potassium 3.1 (*)    Calcium 8.6 (*)    All other components within normal limits  CBC - Abnormal; Notable for the following components:   Hemoglobin 11.4 (*)    All other components within normal limits  URINALYSIS, ROUTINE W REFLEX MICROSCOPIC - Abnormal; Notable for the following components:   Color, Urine YELLOW (*)    APPearance TURBID (*)    Specific Gravity, Urine 1.038 (*)    Bilirubin Urine SMALL (*)    Protein, ur 100 (*)    All other components within normal limits  RESP PANEL BY RT-PCR (RSV, FLU A&B, COVID)  [id number]     EKG  My interpretation of EKG:  Normal sinus rate 67 without any ST elevation or T wave inversions, normal intervals   RADIOLOGY I have reviewed the CT  personally and interpreted no evidence of diverticulitis   PROCEDURES:  Critical Care performed: No  .1-3 Lead EKG Interpretation  Performed by: Lubertha Rush, MD Authorized by: Lubertha Rush, MD     Interpretation: normal     ECG rate:  70   ECG rate assessment: normal     Rhythm: sinus rhythm     Ectopy: none      MEDICATIONS ORDERED IN ED: Medications  sodium chloride 0.9 % bolus 1,000 mL (has no administration in time range)  ondansetron (ZOFRAN) injection 4 mg (has no administration in time range)  morphine (PF) 4 MG/ML injection 4 mg (has no administration in time range)  iohexol (OMNIPAQUE) 300 MG/ML solution 100 mL (100 mLs Intravenous Contrast Given 08/19/23 1535)     IMPRESSION / MDM / ASSESSMENT AND PLAN / ED COURSE  I reviewed the triage vital signs and the nursing notes.   Patient's presentation is most consistent with acute presentation with potential threat to life or bodily function.   Patient comes in with concerns for abdominal pain.  CT imaging will be ordered to evaluate for diverticulitis, abscess, perforation or other acute pathology.  Troponin negative  lipase slightly elevated but her pain is more lower in nature.  Potassium is slightly low we will trial some p.o. potassium if CT is negative.  CBC shows slightly low hemoglobin but stable from prior.  Urine without evidence of UTI.  COVID, flu are negative   MPRESSION:  1. No acute localizing process in the abdomen or pelvis.  2. Colonic diverticulosis without evidence for diverticulitis.  3. 3 mm left solid pulmonary nodule. No follow-up needed if patient  is low-risk.This recommendation follows the consensus statement:  Guidelines for Management of Incidental Pulmonary Nodules Detected  on CT Images: From the Fleischner Society 2017; Radiology 2017;  [id number].    Aortic Atherosclerosis (ICD10-I70.0).    CT imaging does not show appendix but patient states that she does not have appendix but this was already removed.  I did discuss with the radiologist to confirm there was no diverticulosis that was actively inflamed and they deny any diverticulitis.  They feel comfortable with patient being discharged home patient requesting some antispasmodic medication I think she was on Bentyl previously and wanted to try that again her workup otherwise has been reassuring she is tolerated p.o. and feels comfortable with discharge home.  No recent antibiotics to suggest C. difficile and her diarrhea is got better so we will hold off on antibiotics at this time but I suspect patient has gastroenteritis given multiple episodes of nausea vomiting diarrhea.      FINAL CLINICAL IMPRESSION(S) / ED DIAGNOSES   Final diagnoses:  Gastroenteritis     Rx / DC Orders   ED Discharge Orders     None        Note:  This document was prepared using Dragon voice recognition software and may include unintentional dictation errors.   Lubertha Rush, MD 08/19/23 [phone]

## 2023-09-02 ENCOUNTER — Encounter: Payer: Self-pay | Admitting: Family Medicine

## 2023-09-02 ENCOUNTER — Ambulatory Visit: Payer: Self-pay | Admitting: Family Medicine

## 2023-09-02 VITALS — BP 150/90 | HR 86 | Resp 16 | Ht 64.0 in | Wt 128.8 lb

## 2023-09-02 DIAGNOSIS — J410 Simple chronic bronchitis: Secondary | ICD-10-CM

## 2023-09-02 DIAGNOSIS — Z1231 Encounter for screening mammogram for malignant neoplasm of breast: Secondary | ICD-10-CM

## 2023-09-02 DIAGNOSIS — G8929 Other chronic pain: Secondary | ICD-10-CM

## 2023-09-02 DIAGNOSIS — I700 Atherosclerosis of aorta: Secondary | ICD-10-CM

## 2023-09-02 DIAGNOSIS — Z9109 Other allergy status, other than to drugs and biological substances: Secondary | ICD-10-CM

## 2023-09-02 DIAGNOSIS — I10 Essential (primary) hypertension: Secondary | ICD-10-CM

## 2023-09-02 DIAGNOSIS — D509 Iron deficiency anemia, unspecified: Secondary | ICD-10-CM

## 2023-09-02 DIAGNOSIS — F3162 Bipolar disorder, current episode mixed, moderate: Secondary | ICD-10-CM | POA: Diagnosis not present

## 2023-09-02 DIAGNOSIS — M5441 Lumbago with sciatica, right side: Secondary | ICD-10-CM

## 2023-09-02 DIAGNOSIS — G43109 Migraine with aura, not intractable, without status migrainosus: Secondary | ICD-10-CM

## 2023-09-02 DIAGNOSIS — F1021 Alcohol dependence, in remission: Secondary | ICD-10-CM | POA: Diagnosis not present

## 2023-09-02 DIAGNOSIS — K219 Gastro-esophageal reflux disease without esophagitis: Secondary | ICD-10-CM

## 2023-09-02 DIAGNOSIS — M542 Cervicalgia: Secondary | ICD-10-CM

## 2023-09-02 MED ORDER — BREZTRI AEROSPHERE 160-9-4.8 MCG/ACT IN AERO: 2.0000 | INHALATION_SPRAY | Freq: Two times a day (BID) | RESPIRATORY_TRACT | 1 refills | Status: AC

## 2023-09-02 MED ORDER — OLMESARTAN MEDOXOMIL 40 MG PO TABS: ORAL_TABLET | ORAL | 1 refills | Status: AC

## 2023-09-02 MED ORDER — TRAMADOL HCL 50 MG PO TABS: 50.0000 mg | ORAL_TABLET | Freq: Every evening | ORAL | 0 refills | Status: DC

## 2023-09-02 MED ORDER — AMLODIPINE BESYLATE 5 MG PO TABS: 5.0000 mg | ORAL_TABLET | Freq: Every day | ORAL | 0 refills | Status: AC

## 2023-09-02 MED ORDER — METAXALONE 800 MG PO TABS: 800.0000 mg | ORAL_TABLET | Freq: Three times a day (TID) | ORAL | 0 refills | Status: AC

## 2023-09-02 MED ORDER — MONTELUKAST SODIUM 10 MG PO TABS: 10.0000 mg | ORAL_TABLET | Freq: Every day | ORAL | 1 refills | Status: AC

## 2023-09-02 MED ORDER — OMEPRAZOLE 40 MG PO CPDR: DELAYED_RELEASE_CAPSULE | ORAL | 1 refills | Status: AC

## 2023-09-02 MED ORDER — ROSUVASTATIN CALCIUM 20 MG PO TABS: 20.0000 mg | ORAL_TABLET | Freq: Every day | ORAL | 1 refills | Status: AC

## 2023-09-02 NOTE — Progress Notes (Signed)
 Name: MATSUE PICCO   MRN: [id number]    DOB: [birth year]   Date:09/02/2023       Progress Note  Subjective  Chief Complaint  Chief Complaint  Patient presents with   Medical Management of Chronic Issues   HPI   Discussed the use of AI scribe software for clinical note transcription with the patient, who gave verbal consent to proceed.  History of Present Illness Nancy Lucero is a 58 year old female with hypertension, atherosclerosis, and chronic bronchitis who presents for medication management and follow-up.  She began experiencing severe gastrointestinal symptoms on August 19, 2023, characterized by cramping and diarrhea described as 'pure water .' A CT scan on August 22, 2023, ruled out diverticulitis but noted atherosclerosis of the aorta. She was prescribed Bentyl  for cramps. She has not been on antibiotics.  She has a history of hypertension with recent readings of 160/70 and 170/?Aaron Aas She occasionally forgets to take her medications, which include amlodipine  and olmesartan . She is currently taking rosuvastatin  for cholesterol management and requires a refill.  She has chronic bronchitis and reports quitting smoking, though occasionally relapses. She experienced a recent respiratory illness with significant coughing and sweating but no fever. She uses a Breo inhaler but admits to inconsistent use.  She has bipolar and PTSD and is currently taking fluoxetine  and Klonopin  for panic attacks. She no longer takes Zyprexa. Under the care of psychiatrist   She has a history of migraines, typically occurring at night, waking her with severe headaches and nausea. She has not experienced a migraine in the past week, which is unusual for her.  She has chronic back pain and a history of cervical spine fusion. She was prescribed tramadol  for pain, but insurance issues have prevented her from obtaining it. She also uses Skelaxin  for back spasms and is running low.  She has a history of anorexia  nervosa restrictive type and reports recent issues with appetite, though she is regaining it. She weighs 128 pounds. She has experienced nausea and epigastric pain, for which she takes omeprazole , which helps. She has a history of iron  deficiency anemia colonoscopy done in 2024 and showed a polypd  She has a history of alcohol dependence and currently consumes alcohol about once or twice a week, using flavored alcohols.      Patient Active Problem List   Diagnosis Date Noted   Normocytic anemia 11/02/2022   IDA (iron  deficiency anemia) 11/02/2022   Migraine with aura and without status migrainosus, not intractable 10/21/2022   Positive colorectal cancer screening using Cologuard test 06/02/2022   Adenomatous polyp of colon 06/02/2022   Alcohol dependence in remission (HCC) 03/25/2021   Esophageal dysphagia 05/09/2020   History of diarrhea 05/09/2020   PTSD (post-traumatic stress disorder) 11/30/2018   Insomnia due to medical condition 11/30/2018   Torn meniscus 06/21/2018   Hypertension 09/01/2017   S/P cervical spinal fusion 12/01/2016   Pain management contract signed 12/05/2015   MVP (mitral valve prolapse) 08/28/2015   Dyslipidemia 08/28/2015   Anxiety 08/28/2015   Bipolar disorder (HCC) 08/28/2015   History of seizures 08/28/2015   Chronic back pain 10/20/2014   Cervical radiculitis 10/20/2014   Anorexia nervosa, restricting type 10/20/2014   Fibromyalgia syndrome 10/20/2014   Fibrocystic breast disease (FCBD) in female 10/20/2014   Gastro-esophageal reflux disease without esophagitis 10/20/2014   Irritable bowel syndrome (IBS) 10/20/2014   Migraine without aura and responsive to treatment 10/20/2014   Panic attacks 10/20/2014   Spondylosis 10/20/2014  Tobacco use 10/20/2014   Mixed urge and stress incontinence 10/20/2014   Atrophy of vagina 10/20/2014    Past Surgical History:  Procedure Laterality Date   ABDOMINAL HYSTERECTOMY     APPENDECTOMY     COLONOSCOPY  WITH PROPOFOL  N/A 06/02/2022   Procedure: COLONOSCOPY WITH PROPOFOL ;  Surgeon: Luke Salaam, MD;  Location: [facility] ENDOSCOPY;  Service: Gastroenterology;  Laterality: N/A;   KNEE SURGERY Right 10/15/2015   [facility]-removed meniscus   SPINAL FUSION     Neck 3/4/5   TONSILLECTOMY     TOOTH EXTRACTION  02/03/2016    Family History  Problem Relation Age of Onset   Mental illness Mother    Heart disease Mother    Depression Son    Mental illness Son    Mental illness Maternal Aunt    Depression Son    Mental illness Son    Depression Son    Mental illness Son    Congestive Heart Failure Father    Atrial fibrillation Father    Heart disease Father    Mental illness Sister     Social History   Tobacco Use   Smoking status: Every Day    Current packs/day: 1.00    Average packs/day: 1 pack/day for 46.3 years (46.3 ttl pk-yrs)    Types: E-cigarettes, Cigarettes    Start date: 05/04/1977    Passive exposure: Current   Smokeless tobacco: Never   Tobacco comments:    smoking a couple daily again, she had quit   Substance Use Topics   Alcohol use: Yes     Current Outpatient Medications:    albuterol  (VENTOLIN  HFA) 108 (90 Base) MCG/ACT inhaler, Inhale 2 puffs into the lungs every 4 (four) hours as needed., Disp: 18 g, Rfl: 0   amLODipine  (NORVASC ) 2.5 MG tablet, Take 1 tablet (2.5 mg total) by mouth daily. TAKE 1 TABLET DAILY, Disp: 90 tablet, Rfl: 1   b complex vitamins capsule, Take by mouth., Disp: , Rfl:    Budeson-Glycopyrrol-Formoterol (BREZTRI  AEROSPHERE) 160-9-4.8 MCG/ACT AERO, Inhale 2 puffs into the lungs 2 (two) times daily., Disp: 32.1 g, Rfl: 1   clonazePAM  (KLONOPIN ) 1 MG tablet, Take 1 mg by mouth 4 (four) times daily as needed., Disp: , Rfl:    FLUoxetine  (PROZAC ) 20 MG capsule, Take 60 mg by mouth daily., Disp: , Rfl:    Iron -Vitamin C  65-125 MG TABS, Take 1 tablet by mouth daily., Disp: 30 tablet, Rfl: 2   metaxalone  (SKELAXIN ) 800 MG tablet, Take 1 tablet (800 mg total)  by mouth 3 (three) times daily., Disp: 180 tablet, Rfl: 0   montelukast  (SINGULAIR ) 10 MG tablet, Take 1 tablet (10 mg total) by mouth at bedtime., Disp: 90 tablet, Rfl: 1   naloxone  (NARCAN ) nasal spray 4 mg/0.1 mL, Place 1 spray into the nose daily as needed., Disp: 2 each, Rfl: 0   OLANZapine (ZYPREXA) 7.5 MG tablet, Take 7.5 mg by mouth at bedtime., Disp: , Rfl:    olmesartan  (BENICAR ) 40 MG tablet, TAKE 1 TABLET DAILY, Disp: 90 tablet, Rfl: 1   omeprazole  (PRILOSEC) 40 MG capsule, TAKE 1 CAPSULE(40 MG) BY MOUTH DAILY, Disp: 90 capsule, Rfl: 1   ondansetron  (ZOFRAN -ODT) 8 MG disintegrating tablet, Take 8 mg by mouth 2 (two) times daily as needed., Disp: , Rfl:    rosuvastatin  (CRESTOR ) 20 MG tablet, Take 1 tablet (20 mg total) by mouth daily. TAKE 1 TABLET DAILY, Disp: 90 tablet, Rfl: 1   Spacer/Aero-Holding Chambers (AEROCHAMBER MV) inhaler, Use  as instructed, Disp: 1 each, Rfl: 2   traMADol  (ULTRAM ) 50 MG tablet, Take 1 tablet (50 mg total) by mouth every evening. Chronic back pain , takes it at night - do not take it with BZD due to risk of respiratory suppression, Disp: 90 tablet, Rfl: 0   Ubrogepant  (UBRELVY ) 100 MG TABS, Take 1 tablet by mouth daily as needed., Disp: 16 tablet, Rfl: 1   dicyclomine  (BENTYL ) 10 MG capsule, Take 1 capsule (10 mg total) by mouth 4 (four) times daily -  before meals and at bedtime for 5 days., Disp: 20 capsule, Rfl: 0  Allergies  Allergen Reactions   Cephalosporins Anaphylaxis   Cortisone     blackouts   Cephalexin    Lyrica  [Pregabalin ]     Stutters with medication    Penicillins    Sulfa Antibiotics    Sulfacetamide Sodium    Fentanyl Nausea And Vomiting    blindness    I personally reviewed active problem list, medication list, allergies, family history with the patient/caregiver today.   ROS  Ten systems reviewed and is negative except as mentioned in HPI    Objective Physical Exam  CONSTITUTIONAL: Patient appears well-developed and  well-nourished. No distress. HEENT: Head atraumatic, normocephalic, neck supple. CARDIOVASCULAR: Normal rate, regular rhythm and normal heart sounds. No murmur heard. No BLE edema. PULMONARY: Effort normal. Breath sounds include bronchi. No respiratory distress. ABDOMINAL: There is no tenderness or distention. MUSCULOSKELETAL:slow getting up from chair, pain during palpation lumbar spine. No gross motor or sensory deficit. PSYCHIATRIC: Patient has a normal mood and affect. Behavior is normal. Judgment and thought content normal.  Vitals:   09/02/23 1012  BP: (!) 162/104  Pulse: 86  Resp: 16  SpO2: 98%  Weight: 128 lb 12.8 oz (58.4 kg)  Height: 5\' 4"  (1.626 m)    Body mass index is 22.11 kg/m.  Recent Results (from the past 2160 hours)  Lipase, blood     Status: Abnormal   Collection Time: 08/19/23  1:05 PM  Result Value Ref Range   Lipase 78 (H) 11 - 51 U/L    Comment: Performed at [facility], [address] [place], [place] [address]  Comprehensive metabolic panel     Status: Abnormal   Collection Time: 08/19/23  1:05 PM  Result Value Ref Range   Sodium 144 135 - 145 mmol/L   Potassium 3.1 (L) 3.5 - 5.1 mmol/L   Chloride 107 98 - 111 mmol/L   CO2 27 22 - 32 mmol/L   Glucose, Bld 97 70 - 99 mg/dL    Comment: Glucose reference range applies only to samples taken after fasting for at least 8 hours.   BUN 15 6 - 20 mg/dL   Creatinine, Ser 1.91 0.44 - 1.00 mg/dL   Calcium  8.6 (L) 8.9 - 10.3 mg/dL   Total Protein 7.1 6.5 - 8.1 g/dL   Albumin 3.7 3.5 - 5.0 g/dL   AST 24 15 - 41 U/L   ALT 8 0 - 44 U/L   Alkaline Phosphatase 64 38 - 126 U/L   Total Bilirubin 0.4 0.0 - 1.2 mg/dL   GFR, Estimated >47 >82 mL/min    Comment: (NOTE) Calculated using the CKD-EPI Creatinine Equation (2021)    Anion gap 10 5 - 15    Comment: Performed at [facility], [address] [place], [place] [address]  CBC     Status: Abnormal   Collection Time: 08/19/23  1:05 PM  Result Value Ref Range   WBC 6.4 4.0 - 10.5 K/uL   RBC 3.97 3.87 - 5.11 MIL/uL   Hemoglobin 11.4 (L) 12.0 - 15.0 g/dL   HCT 16.1 09.6 - 04.5 %   MCV 92.2 80.0 - 100.0 fL   MCH 28.7 26.0 - 34.0 pg   MCHC 31.1 30.0 - 36.0 g/dL   RDW 40.9 81.1 - 91.4 %   Platelets 246 150 - 400 K/uL   nRBC 0.0 0.0 - 0.2 %    Comment: Performed at [facility], [address] [place], [place] [address]  Urinalysis, Routine w reflex microscopic -Urine, Clean Catch     Status: Abnormal   Collection Time: 08/19/23  1:05 PM  Result Value Ref Range   Color, Urine YELLOW (A) YELLOW   APPearance TURBID (A) CLEAR   Specific Gravity, Urine 1.038 (H) 1.005 - 1.030   pH 5.0 5.0 - 8.0   Glucose, UA NEGATIVE NEGATIVE mg/dL   Hgb urine dipstick NEGATIVE NEGATIVE   Bilirubin Urine SMALL (A) NEGATIVE   Ketones, ur NEGATIVE NEGATIVE mg/dL   Protein, ur 621 (A) NEGATIVE mg/dL   Nitrite NEGATIVE NEGATIVE   Leukocytes,Ua NEGATIVE NEGATIVE   RBC / HPF 0-5 0 - 5 RBC/hpf   WBC, UA 0 0 - 5 WBC/hpf   Bacteria, UA NONE SEEN NONE SEEN   Squamous Epithelial / HPF 0 0 - 5 /HPF   Mucus PRESENT    Amorphous Crystal PRESENT     Comment: Performed at [facility], [address] [place], [place] [address]  Resp panel by RT-PCR (RSV, Flu A&B, Covid) Anterior Nasal Swab     Status: None   Collection Time: 08/19/23  1:05 PM   Specimen: Anterior Nasal Swab  Result Value Ref Range   SARS Coronavirus 2 by RT PCR NEGATIVE NEGATIVE    Comment: (NOTE) SARS-CoV-2 target nucleic acids are NOT DETECTED.  The SARS-CoV-2 RNA is generally detectable in upper respiratory specimens during the acute phase of infection. The lowest concentration of SARS-CoV-2 viral copies this assay can detect is 138 copies/mL. A negative result does not preclude SARS-Cov-2 infection and should not be used as the sole basis for treatment or other patient management decisions. A negative result may occur with  improper specimen  collection/handling, submission of specimen other than nasopharyngeal swab, presence of viral mutation(s) within the areas targeted by this assay, and inadequate number of viral copies(<138 copies/mL). A negative result must be combined with clinical observations, patient history, and epidemiological information. The expected result is Negative.  Fact Sheet for Patients:  BloggerCourse.com  Fact Sheet for Healthcare Providers:  SeriousBroker.it  This test is no t yet approved or cleared by the [place]  FDA and  has been authorized for detection and/or diagnosis of SARS-CoV-2 by FDA under an Emergency Use Authorization (EUA). This EUA will remain  in effect (meaning this test can be used) for the duration of the COVID-19 declaration under Section 564(b)(1) of the Act, 21 U.S.C.section 360bbb-3(b)(1), unless the authorization is terminated  or revoked sooner.       Influenza A by PCR NEGATIVE NEGATIVE   Influenza B by PCR NEGATIVE NEGATIVE    Comment: (NOTE) The Xpert Xpress SARS-CoV-2/FLU/RSV plus assay is intended as an aid in the diagnosis of influenza from Nasopharyngeal swab specimens and should not be used as a sole basis for treatment. Nasal washings and aspirates are unacceptable for Xpert Xpress SARS-CoV-2/FLU/RSV testing.  Fact Sheet for Patients: BloggerCourse.com  Fact Sheet for Healthcare Providers: SeriousBroker.it  This test is not yet approved or cleared by the [place]  FDA and has been authorized for detection and/or diagnosis of SARS-CoV-2 by FDA under an Emergency Use Authorization (EUA). This EUA will remain in effect (meaning this test can be used) for the duration of the COVID-19 declaration under Section 564(b)(1) of the Act, 21 U.S.C. section 360bbb-3(b)(1), unless the authorization is terminated or revoked.     Resp Syncytial Virus by PCR  NEGATIVE NEGATIVE    Comment: (NOTE) Fact Sheet for Patients: BloggerCourse.com  Fact Sheet for Healthcare Providers: SeriousBroker.it  This test is not yet approved or cleared by the [place]  FDA and has been authorized for detection and/or diagnosis of SARS-CoV-2 by FDA under an Emergency Use Authorization (EUA). This EUA will remain in effect (meaning this test can be used) for the duration of the COVID-19 declaration under Section 564(b)(1) of the Act, 21 U.S.C. section 360bbb-3(b)(1), unless the authorization is terminated or revoked.  Performed at [facility], [address] [place], [place] [address]   Troponin I (High Sensitivity)     Status: None   Collection Time: 08/19/23  1:05 PM  Result Value Ref Range   Troponin I (High Sensitivity) 9 <18 ng/L    Comment: (NOTE) Elevated high sensitivity troponin I (hsTnI) values and significant  changes across serial measurements may suggest ACS but many other  chronic and acute conditions are known to elevate hsTnI results.  Refer to the "Links" section for chest pain algorithms and additional  guidance. Performed at [facility], [address] [place], [place] [address]   Troponin I (High Sensitivity)     Status: None   Collection Time: 08/19/23  4:36 PM  Result Value Ref Range   Troponin I (High Sensitivity) 10 <18 ng/L    Comment: (NOTE) Elevated high sensitivity troponin I (hsTnI) values and significant  changes across serial measurements may suggest ACS but many other  chronic and acute conditions are known to elevate hsTnI results.  Refer to the "Links" section for chest pain algorithms and additional  guidance. Performed at [facility], [address] [place], [place] [address]     Diabetic Foot Exam:     PHQ2/9:    09/02/2023   10:07 AM 05/21/2023    2:33 PM 03/12/2023    8:27 AM 10/21/2022    9:47 AM 09/25/2022   10:55  AM  Depression screen PHQ 2/9  Decreased Interest 3 3 0 3 0  Down, Depressed, Hopeless 3 3 0 3 0  PHQ - 2 Score 6 6 0 6 0  Altered sleeping 3 3  3  0  Tired, decreased energy 3 3  3  0  Change in appetite 3 3  3  0  Feeling bad or failure about yourself  1 0  3 0  Trouble concentrating 3 0  3 0  Moving slowly or fidgety/restless 0 0  3 0  Suicidal thoughts 0 0  2 0  PHQ-9 Score 19 15  26  0  Difficult doing work/chores Very difficult Somewhat difficult  Extremely dIfficult Not difficult at all    phq 9 is positive  Fall Risk:    09/02/2023   10:09 AM 05/21/2023    2:33 PM 03/12/2023    8:27 AM 10/21/2022    9:47 AM 09/25/2022   10:55 AM  Fall Risk   Falls in the past year? 1 0 1 0 0  Number falls in  past yr: 1 0 0  0  Injury with Fall? 0 0 0  0  Risk for fall due to : Impaired balance/gait No Fall Risks History of fall(s) No Fall Risks   Follow up Education provided;Falls evaluation completed;Falls prevention discussed Falls prevention discussed;Education provided;Falls evaluation completed Falls prevention discussed;Education provided;Falls evaluation completed Falls prevention discussed     Assessment & Plan Uncontrolled hypertension Blood pressure elevated at 160-170 mmHg due to occasional non-adherence. Current regimen insufficient. - Increase amlodipine  to 5 mg daily. - Continue olmesartan  40 mg daily. - Advise use of a pill box for adherence. - Schedule follow-up blood pressure check in 2 weeks. - Adjust amlodipine  if blood pressure remains uncontrolled. - Target blood pressure below 135/80 mmHg.  Dyslipidemia/Atherosclerosis of Aorta Managed with rosuvastatin . Atherosclerosis of the aorta noted on imaging. - Refill rosuvastatin  prescription.  Chronic simple  bronchitis Recent respiratory infection with cough and night sweats. Occasional smoking and suboptimal inhaler use. - Instruct to use Bevespi two puffs twice daily. - Advise to place inhaler by toothbrush for  adherence.  Chronic back pain Chronic pain with history of cervical spine fusion. Current pain level 2/10. Insurance issues with tramadol . - Prescribe tramadol  with cash payment option. - Prescribe Skelaxin  for muscle spasms.  Gastroesophageal reflux disease (GERD) Experiences epigastric pain and occasional vomiting. Omeprazole  effective. Stress exacerbates symptoms. - Continue omeprazole  as prescribed.  Alcoholism in remission -still drinking but down to one to two shots at most twice a week  Iron  deficiency anemia Mild anemia noted. Previous positive Cologuard and colonoscopy with polyps. - Order iron  studies to assess current status.  Anorexia nervosa, restrictive type Recent appetite issues and weight at 128 lbs. Ongoing nausea. Insurance issues with GI specialist resolved. - Encourage follow-up with GI specialist.  PTSD/Bipolar 1 disorder/moderate Under care of Dr. Gavin Kast, taking fluoxetine  and clonazepam . -off zyprexa  General Health Maintenance History of alcohol dependence, currently moderate consumption. No recent headaches, reducing Goody powders. - Schedule physical examination now that insurance is active.  Follow-up Requires follow-up for multiple conditions including hypertension and anemia. - Schedule follow-up appointment in 3 months. - Recheck blood pressure in 2 weeks.

## 2023-09-02 NOTE — Patient Instructions (Signed)
[facility]

## 2023-09-03 ENCOUNTER — Encounter: Payer: Self-pay | Admitting: Family Medicine

## 2023-09-03 LAB — LIPID PANEL
Cholesterol: 144 mg/dL (ref <200)
HDL: 66 mg/dL (ref >=50)
LDL Cholesterol (Calc): 58 mg/dL
Non-HDL Cholesterol (Calc): 78 mg/dL (ref <130)
Total CHOL/HDL Ratio: 2.2 (calc) (ref <5.0)
Triglycerides: 122 mg/dL (ref <150)

## 2023-09-03 LAB — COMPREHENSIVE METABOLIC PANEL WITH GFR
AG Ratio: 1.3 (calc) (ref 1.0–2.5)
ALT: 6 U/L (ref 6–29)
AST: 16 U/L (ref 10–35)
Albumin: 4.1 g/dL (ref 3.6–5.1)
Alkaline phosphatase (APISO): 79 U/L (ref 37–153)
BUN: 22 mg/dL (ref 7–25)
CO2: 28 mmol/L (ref 20–32)
Calcium: 9.3 mg/dL (ref 8.6–10.4)
Chloride: 107 mmol/L (ref 98–110)
Creat: 0.63 mg/dL (ref 0.50–1.05)
Globulin: 3.1 g/dL (ref 1.9–3.7)
Glucose, Bld: 85 mg/dL (ref 65–99)
Potassium: 4.3 mmol/L (ref 3.5–5.3)
Sodium: 145 mmol/L (ref 135–146)
Total Bilirubin: 0.2 mg/dL (ref 0.2–1.2)
Total Protein: 7.2 g/dL (ref 6.1–8.1)
eGFR: 101 mL/min/{1.73_m2} (ref >=60)

## 2023-09-03 LAB — IRON,TIBC AND FERRITIN PANEL
%SAT: 4 % — ABNORMAL LOW (ref 16–45)
Ferritin: 18 ng/mL (ref 16–232)
Iron: 17 ug/dL — ABNORMAL LOW (ref 45–160)
TIBC: 413 ug/dL (ref 250–450)

## 2023-10-12 ENCOUNTER — Encounter: Admitting: Family Medicine

## 2023-12-02 ENCOUNTER — Encounter: Payer: Self-pay | Admitting: Family Medicine

## 2023-12-02 ENCOUNTER — Ambulatory Visit: Admitting: Family Medicine

## 2023-12-02 VITALS — BP 138/80 | HR 93 | Resp 16 | Ht 64.0 in | Wt 136.9 lb

## 2023-12-02 DIAGNOSIS — Z23 Encounter for immunization: Secondary | ICD-10-CM

## 2023-12-02 DIAGNOSIS — G43109 Migraine with aura, not intractable, without status migrainosus: Secondary | ICD-10-CM | POA: Diagnosis not present

## 2023-12-02 DIAGNOSIS — F3162 Bipolar disorder, current episode mixed, moderate: Secondary | ICD-10-CM | POA: Diagnosis not present

## 2023-12-02 DIAGNOSIS — M5441 Lumbago with sciatica, right side: Secondary | ICD-10-CM

## 2023-12-02 DIAGNOSIS — R110 Nausea: Secondary | ICD-10-CM

## 2023-12-02 DIAGNOSIS — K219 Gastro-esophageal reflux disease without esophagitis: Secondary | ICD-10-CM

## 2023-12-02 DIAGNOSIS — G8929 Other chronic pain: Secondary | ICD-10-CM

## 2023-12-02 DIAGNOSIS — I700 Atherosclerosis of aorta: Secondary | ICD-10-CM

## 2023-12-02 DIAGNOSIS — Z1231 Encounter for screening mammogram for malignant neoplasm of breast: Secondary | ICD-10-CM

## 2023-12-02 DIAGNOSIS — I10 Essential (primary) hypertension: Secondary | ICD-10-CM | POA: Diagnosis not present

## 2023-12-02 MED ORDER — TRAMADOL HCL 50 MG PO TABS
50.0000 mg | ORAL_TABLET | Freq: Every evening | ORAL | 0 refills | Status: AC
Start: 2023-12-02 — End: ?

## 2023-12-02 NOTE — Progress Notes (Signed)
 Name: Nancy Lucero   MRN: [id number]    DOB: [birth year]   Date:12/02/2023       Progress Note  Subjective  Chief Complaint  Chief Complaint  Patient presents with   Medical Management of Chronic Issues   Discussed the use of AI scribe software for clinical note transcription with the patient, who gave verbal consent to proceed.  History of Present Illness Nancy Lucero is a 58 year old female with GERD and bulimia who presents with chronic nausea and vomiting.  She experiences chronic nausea and vomiting, which are unpredictable and not related to specific foods. She was up from midnight to 3 AM the previous day vomiting undigested food. She has a history of GERD and bulimia but has not seen a gastroenterologist in a long time. She takes Prilosec (omeprazole ) regularly now, which she had not been doing previously.  She is currently taking Zofran  (ondansetron ) 8 mg, prescribed by her psychiatrist. She also takes Prozac  (fluoxetine ) 60 mg for PTSD, and Klonopin  (clonazepam ) as needed, though she rarely uses it. She has tried multiple mood stabilizers for bipolar disorder type 1 but stopped due to side effects  Her blood pressure fluctuates with stress, and she is currently taking amlodipine  40 mg. She experiences dizziness when her blood pressure drops too low, but most of the time feels well on medication  She has chronic lower back pain, for which she takes tramadol  as needed, but not daily, due to the risk of respiratory suppression when combined with Klonopin . She also has a history of migraines with aura, which are currently well-controlled without the need for Ubrelvy .  She is dealing with significant family stress, including involvement with Child Protective Services concerning her granddaughter and son. This situation is contributing to her stress levels and may be impacting her health.    Patient Active Problem List   Diagnosis Date Noted   Normocytic anemia 11/02/2022    IDA (iron  deficiency anemia) 11/02/2022   Migraine with aura and without status migrainosus, not intractable 10/21/2022   Positive colorectal cancer screening using Cologuard test 06/02/2022   Adenomatous polyp of colon 06/02/2022   Alcohol dependence in remission (HCC) 03/25/2021   Esophageal dysphagia 05/09/2020   History of diarrhea 05/09/2020   PTSD (post-traumatic stress disorder) 11/30/2018   Insomnia due to medical condition 11/30/2018   Torn meniscus 06/21/2018   Hypertension 09/01/2017   S/P cervical spinal fusion 12/01/2016   Pain management contract signed 12/05/2015   MVP (mitral valve prolapse) 08/28/2015   Dyslipidemia 08/28/2015   Anxiety 08/28/2015   Bipolar disorder (HCC) 08/28/2015   History of seizures 08/28/2015   Chronic back pain 10/20/2014   Cervical radiculitis 10/20/2014   Anorexia nervosa, restricting type 10/20/2014   Fibromyalgia syndrome 10/20/2014   Fibrocystic breast disease (FCBD) in female 10/20/2014   Gastro-esophageal reflux disease without esophagitis 10/20/2014   Irritable bowel syndrome (IBS) 10/20/2014   Migraine without aura and responsive to treatment 10/20/2014   Panic attacks 10/20/2014   Spondylosis 10/20/2014   Tobacco use 10/20/2014   Mixed urge and stress incontinence 10/20/2014   Atrophy of vagina 10/20/2014    Past Surgical History:  Procedure Laterality Date   ABDOMINAL HYSTERECTOMY     APPENDECTOMY     COLONOSCOPY WITH PROPOFOL  N/A 06/02/2022   Procedure: COLONOSCOPY WITH PROPOFOL ;  Surgeon: Therisa Bi, MD;  Location: [facility] ENDOSCOPY;  Service: Gastroenterology;  Laterality: N/A;   KNEE SURGERY Right 10/15/2015   [facility]-removed meniscus   SPINAL FUSION  Neck 3/4/5   TONSILLECTOMY     TOOTH EXTRACTION  02/03/2016    Family History  Problem Relation Age of Onset   Mental illness Mother    Heart disease Mother    Depression Son    Mental illness Son    Mental illness Maternal Aunt    Depression Son    Mental illness  Son    Depression Son    Mental illness Son    Congestive Heart Failure Father    Atrial fibrillation Father    Heart disease Father    Mental illness Sister     Social History   Tobacco Use   Smoking status: Every Day    Current packs/day: 1.00    Average packs/day: 1 pack/day for 46.6 years (46.6 ttl pk-yrs)    Types: E-cigarettes, Cigarettes    Start date: 05/04/1977    Passive exposure: Current   Smokeless tobacco: Never   Tobacco comments:    smoking a couple daily again, she had quit   Substance Use Topics   Alcohol use: Yes     Current Outpatient Medications:    albuterol  (VENTOLIN  HFA) 108 (90 Base) MCG/ACT inhaler, Inhale 2 puffs into the lungs every 4 (four) hours as needed., Disp: 18 g, Rfl: 0   amLODipine  (NORVASC ) 5 MG tablet, Take 1 tablet (5 mg total) by mouth daily. TAKE 1 TABLET DAILY, Disp: 90 tablet, Rfl: 0   b complex vitamins capsule, Take by mouth., Disp: , Rfl:    budeson-glycopyrrolate-formoterol (BREZTRI  AEROSPHERE) 160-9-4.8 MCG/ACT AERO inhaler, Inhale 2 puffs into the lungs 2 (two) times daily., Disp: 32.1 g, Rfl: 1   clonazePAM  (KLONOPIN ) 1 MG tablet, Take 1 mg by mouth 4 (four) times daily as needed., Disp: , Rfl:    FLUoxetine  (PROZAC ) 20 MG capsule, Take 60 mg by mouth daily., Disp: , Rfl:    Iron -Vitamin C  65-125 MG TABS, Take 1 tablet by mouth daily., Disp: 30 tablet, Rfl: 2   metaxalone  (SKELAXIN ) 800 MG tablet, Take 1 tablet (800 mg total) by mouth 3 (three) times daily., Disp: 180 tablet, Rfl: 0   montelukast  (SINGULAIR ) 10 MG tablet, Take 1 tablet (10 mg total) by mouth at bedtime., Disp: 90 tablet, Rfl: 1   naloxone  (NARCAN ) nasal spray 4 mg/0.1 mL, Place 1 spray into the nose daily as needed., Disp: 2 each, Rfl: 0   olmesartan  (BENICAR ) 40 MG tablet, TAKE 1 TABLET DAILY, Disp: 90 tablet, Rfl: 1   omeprazole  (PRILOSEC) 40 MG capsule, TAKE 1 CAPSULE(40 MG) BY MOUTH DAILY, Disp: 90 capsule, Rfl: 1   ondansetron  (ZOFRAN -ODT) 8 MG disintegrating  tablet, Take 8 mg by mouth 2 (two) times daily as needed., Disp: , Rfl:    rosuvastatin  (CRESTOR ) 20 MG tablet, Take 1 tablet (20 mg total) by mouth daily. TAKE 1 TABLET DAILY, Disp: 90 tablet, Rfl: 1   Spacer/Aero-Holding Chambers (AEROCHAMBER MV) inhaler, Use as instructed, Disp: 1 each, Rfl: 2   Ubrogepant  (UBRELVY ) 100 MG TABS, Take 1 tablet by mouth daily as needed., Disp: 16 tablet, Rfl: 1   traMADol  (ULTRAM ) 50 MG tablet, Take 1 tablet (50 mg total) by mouth every evening. Chronic back pain , takes it at night - do not take it with BZD due to risk of respiratory suppression, Disp: 90 tablet, Rfl: 0  Allergies  Allergen Reactions   Cephalosporins Anaphylaxis   Cortisone     blackouts   Cephalexin    Lyrica  [Pregabalin ]     Stutters with  medication    Penicillins    Sulfa Antibiotics    Sulfacetamide Sodium    Fentanyl Nausea And Vomiting    blindness    I personally reviewed active problem list, medication list, allergies with the patient/caregiver today.   ROS  Ten systems reviewed and is negative except as mentioned in HPI    Objective Physical Exam  CONSTITUTIONAL: Patient appears well-developed and well-nourished. No distress. HEENT: Head atraumatic, normocephalic, neck supple. CARDIOVASCULAR: Normal rate, regular rhythm and normal heart sounds. No murmur heard. No BLE edema. PULMONARY: Effort normal and breath sounds normal. No respiratory distress. ABDOMINAL: Abdomen non-tender. There is no distention. MUSCULOSKELETAL: Normal gait. Without gross motor or sensory deficit. PSYCHIATRIC: Patient seems anxious , flight of ideas  Vitals:   12/02/23 1044 12/02/23 1057  BP: (!) 144/84 138/80  Pulse: 93   Resp: 16   SpO2: 95%   Weight: 136 lb 14.4 oz (62.1 kg)   Height: 5' 4 (1.626 m)     Body mass index is 23.5 kg/m.  No results found for this or any previous visit (from the past 2160 hours).  Diabetic Foot Exam:     PHQ2/9:    12/02/2023   10:41 AM  09/02/2023   10:07 AM 05/21/2023    2:33 PM 03/12/2023    8:27 AM 10/21/2022    9:47 AM  Depression screen PHQ 2/9  Decreased Interest 3 3 3  0 3  Down, Depressed, Hopeless 3 3 3  0 3  PHQ - 2 Score 6 6 6  0 6  Altered sleeping 3 3 3  3   Tired, decreased energy 3 3 3  3   Change in appetite 3 3 3  3   Feeling bad or failure about yourself  1 1 0  3  Trouble concentrating 3 3 0  3  Moving slowly or fidgety/restless 0 0 0  3  Suicidal thoughts 0 0 0  2  PHQ-9 Score 19 19 15  26   Difficult doing work/chores Very difficult Very difficult Somewhat difficult  Extremely dIfficult    phq 9 is positive  Fall Risk:    12/02/2023   10:35 AM 09/02/2023   10:09 AM 05/21/2023    2:33 PM 03/12/2023    8:27 AM 10/21/2022    9:47 AM  Fall Risk   Falls in the past year? 0 1 0 1 0  Number falls in past yr: 0 1 0 0   Injury with Fall? 0 0 0 0   Risk for fall due to : No Fall Risks Impaired balance/gait No Fall Risks History of fall(s) No Fall Risks  Follow up Falls evaluation completed Education provided;Falls evaluation completed;Falls prevention discussed Falls prevention discussed;Education provided;Falls evaluation completed Falls prevention discussed;Education provided;Falls evaluation completed Falls prevention discussed      Assessment & Plan Chronic nausea and vomiting with gastroesophageal reflux disease Chronic nausea and vomiting likely related to stress and GERD, not bulimia. Psychiatrist diagnosed PTSD, also has bipolar type 1 based on symptoms and old records - Continue Prilosec (omeprazole ) for GERD. - Consult psychiatrist for Zofran  refill as needed. - Consider gastroenterologist referral if symptoms persist or worsen.  Chronic low back pain with intermittent radiculitis  Chronic low back pain primarily on the right side. Tramadol  used sparingly due to respiratory risk with Klonopin . - Prescribe tramadol  with a 90-day supply. - Advise caution with Klonopin  use due to respiratory  risk.  Migraine with aura Migraine with aura well-controlled. No recent use of Ubrelvy .  Essential  hypertension Initial high blood pressure improved post-medication. Dizziness episodes possibly stress-related. - Continue amlodipine  as prescribed. - Consider dosage adjustment to 20 mg twice daily if dizziness persists.  Atherosclerosis of aorta Atherosclerosis managed with rosuvastatin , currently not taken. - Send prescription for rosuvastatin  to pharmacy.

## 2024-02-14 ENCOUNTER — Other Ambulatory Visit (HOSPITAL_COMMUNITY): Payer: Self-pay

## 2024-03-07 ENCOUNTER — Ambulatory Visit: Admitting: Family Medicine
